# Patient Record
Sex: Female | Born: 2000 | Race: Black or African American | Hispanic: No | Marital: Single | Smoking: Never smoker
Health system: Southern US, Community
[De-identification: ages and names within clinical notes are randomized; demographics above are authoritative.]

## PROBLEM LIST (undated history)

## (undated) ENCOUNTER — Inpatient Hospital Stay (HOSPITAL_COMMUNITY): Payer: Self-pay

## (undated) DIAGNOSIS — E559 Vitamin D deficiency, unspecified: Secondary | ICD-10-CM

## (undated) DIAGNOSIS — E611 Iron deficiency: Secondary | ICD-10-CM

## (undated) HISTORY — PX: EYE SURGERY: SHX253

---
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
## 2019-01-16 ENCOUNTER — Other Ambulatory Visit: Payer: Self-pay

## 2019-08-03 ENCOUNTER — Ambulatory Visit (HOSPITAL_COMMUNITY)
Admission: EM | Admit: 2019-08-03 | Discharge: 2019-08-03 | Disposition: A | Discharge status: Home / Self Care | Payer: BLUE CROSS/BLUE SHIELD | Attending: Family Medicine | Admitting: Family Medicine

## 2019-08-03 ENCOUNTER — Encounter (HOSPITAL_COMMUNITY): Payer: Self-pay

## 2019-08-03 ENCOUNTER — Other Ambulatory Visit: Payer: Self-pay

## 2019-08-03 DIAGNOSIS — Z20822 Contact with and (suspected) exposure to covid-19: Secondary | ICD-10-CM | POA: Diagnosis not present

## 2019-08-03 DIAGNOSIS — R05 Cough: Secondary | ICD-10-CM | POA: Diagnosis present

## 2019-08-03 DIAGNOSIS — Z202 Contact with and (suspected) exposure to infections with a predominantly sexual mode of transmission: Secondary | ICD-10-CM | POA: Diagnosis present

## 2019-08-03 DIAGNOSIS — R059 Cough, unspecified: Secondary | ICD-10-CM

## 2019-08-03 DIAGNOSIS — Z113 Encounter for screening for infections with a predominantly sexual mode of transmission: Secondary | ICD-10-CM | POA: Diagnosis not present

## 2019-08-03 NOTE — Discharge Instructions (Addendum)
You will get your test results on MyChart Quarantine yourself at home until your coronavirus test is available Treat pain or fever with Tylenol Use over-the-counter cough and cold medicines You will be called if any of your test, positive Safe sex is recommended

## 2019-08-03 NOTE — ED Provider Notes (Signed)
[facility]    CSN: [id number] Arrival date & time: 08/03/19  1424      History   Chief Complaint Chief Complaint  Patient presents with  . Cough    HPI Hannah Mckay is a 19 y.o. female.   HPI  Patient is here for 2 complaints.  First she said a cough for about a week.  Some tiredness.  No fever or chills.  No loss of taste or smell.  No headache.  No known exposure to coronavirus.  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  She has had unprotected sex.  While she was here she would like to have STD testing.  She has no STD symptoms, discharge, pain, or rash.  History reviewed. No pertinent past medical history.  There are no problems to display for this patient.   History reviewed. No pertinent surgical history.  OB History   No obstetric history on file.      Home Medications    Prior to Admission medications   Medication Sig Start Date End Date Taking? Authorizing Provider  ibuprofen (ADVIL,MOTRIN) 200 MG tablet Take 200-400 mg by mouth every 6 (six) hours as needed for headache.    [provider]  Ketotifen Fumarate (ALLERGY EYE DROPS OP) Place 2 drops into both eyes daily as needed (for allergy eyes).    [provider]    Family History Family History  Problem Relation Age of Onset  . Diabetes Mother   . Asthma Mother   . Healthy Father     Social History Social History   Tobacco Use  . Smoking status: Never Smoker  . Smokeless tobacco: Never Used  Substance Use Topics  . Alcohol use: Yes    Comment: rare  . Drug use: Not on file     Allergies   Cherry   Review of Systems Review of Systems  Constitutional: Negative for chills and fever.  HENT: Negative for congestion and hearing loss.   Eyes: Negative for pain.  Respiratory: Positive for cough. Negative for shortness of breath.   Cardiovascular: Negative for chest pain and leg swelling.    Gastrointestinal: Negative for abdominal pain, constipation and diarrhea.  Genitourinary: Negative for dysuria and frequency.  Musculoskeletal: Negative for myalgias.  Neurological: Negative for dizziness, seizures and headaches.  Psychiatric/Behavioral: The patient is not nervous/anxious.      Physical Exam Triage Vital Signs ED Triage Vitals  Enc Vitals Group     BP 08/03/19 1715 101/63     Pulse Rate 08/03/19 1715 84     Resp 08/03/19 1715 16     Temp 08/03/19 1715 98.7 F (37.1 C)     Temp Source 08/03/19 1715 Oral     SpO2 08/03/19 1715 99 %     Weight --      Height --      Head Circumference --      Peak Flow --      Pain Score 08/03/19 1713 0     Pain Loc --      Pain Edu? --      Excl. in GC? --    No data found.  Updated Vital Signs BP 101/63 (BP Location: Left Arm)   Pulse 84   Temp 98.7 F (37.1 C) (Oral)   Resp 16   SpO2 99%   Visual Acuity Right Eye Distance:   Left Eye Distance:   Bilateral Distance:  Right Eye Near:   Left Eye Near:    Bilateral Near:     Physical Exam Constitutional:      General: She is not in acute distress.    Appearance: She is well-developed.  HENT:     Head: Normocephalic and atraumatic.     Mouth/Throat:     Comments: Mask in place Eyes:     Conjunctiva/sclera: Conjunctivae normal.     Pupils: Pupils are equal, round, and reactive to light.  Cardiovascular:     Rate and Rhythm: Normal rate.     Heart sounds: Normal heart sounds.  Pulmonary:     Effort: Pulmonary effort is normal. No respiratory distress.     Breath sounds: Normal breath sounds.     Comments: Lungs are clear Abdominal:     General: There is no distension.     Palpations: Abdomen is soft.  Genitourinary:    Comments: Exam deferred Musculoskeletal:        General: Normal range of motion.     Cervical back: Normal range of motion.  Skin:    General: Skin is warm and dry.  Neurological:     Mental Status: She is alert. Mental status is  at baseline.  Psychiatric:        Mood and Affect: Mood normal.      UC Treatments / Results  Labs (all labs ordered are listed, but only abnormal results are displayed) Labs Reviewed  NOVEL CORONAVIRUS, NAA (HOSP ORDER, SEND-OUT TO REF LAB; TAT 18-24 HRS)  CERVICOVAGINAL ANCILLARY ONLY    EKG   Radiology No results found.  Procedures Procedures (including critical care time)  Medications Ordered in UC Medications - No data to display  Initial Impression / Assessment and Plan / UC Course  I have reviewed the triage vital signs and the nursing notes.  Pertinent labs & imaging results that were available during my care of the patient were reviewed by me and considered in my medical decision making (see chart for details).     Testing for Covid and STD is performed Patient is told how to access her test We discussed the importance of quarantine until her results are available All questions answered Final Clinical Impressions(s) / UC Diagnoses   Final diagnoses:  Cough  Suspected COVID-19 virus infection  Possible exposure to STD     Discharge Instructions     You will get your test results on MyChart Quarantine yourself at home until your coronavirus test is available Treat pain or fever with Tylenol Use over-the-counter cough and cold medicines You will be called if any of your test, positive Safe sex is recommended   ED Prescriptions    None     PDMP not reviewed this encounter.   Raylene Everts, MD 08/03/19 1755

## 2019-08-03 NOTE — ED Triage Notes (Signed)
Patient presents to Urgent Care with complaints of cough since about a week ago. Patient reports she has also been having nosebleeds (educated on purchase of a humidifier).

## 2019-08-04 LAB — NOVEL CORONAVIRUS, NAA (HOSP ORDER, SEND-OUT TO REF LAB; TAT 18-24 HRS): SARS-CoV-2, NAA: NOT DETECTED

## 2019-08-04 LAB — CERVICOVAGINAL ANCILLARY ONLY
Chlamydia: POSITIVE — AB
Neisseria Gonorrhea: NEGATIVE
Trichomonas: NEGATIVE

## 2019-08-05 ENCOUNTER — Telehealth: Payer: Self-pay | Admitting: Emergency Medicine

## 2019-08-05 MED ORDER — AZITHROMYCIN 250 MG PO TABS: 1000.0000 mg | ORAL_TABLET | Freq: Once | ORAL | 0 refills | Status: AC

## 2019-08-05 NOTE — Telephone Encounter (Signed)
Chlamydia is positive.  Rx po zithromax 1g #1 dose no refills was sent to the pharmacy of record.  Please refrain from sexual intercourse for 7 days to give the medicine time to work, sexual partners need to be notified and tested/treated.  Condoms may reduce risk of reinfection.  Recheck or followup with PCP for further evaluation if symptoms are not improving.   [facility] notified.  Patient contacted by phone and made aware of    results. Pt verbalized understanding and had all questions answered.    

## 2019-10-13 ENCOUNTER — Emergency Department (HOSPITAL_COMMUNITY): Payer: No Typology Code available for payment source

## 2019-10-13 ENCOUNTER — Encounter (HOSPITAL_COMMUNITY): Payer: Self-pay

## 2019-10-13 ENCOUNTER — Emergency Department (HOSPITAL_COMMUNITY)
Admission: EM | Admit: 2019-10-13 | Discharge: 2019-10-13 | Disposition: A | Discharge status: Home / Self Care | Payer: No Typology Code available for payment source | Attending: Emergency Medicine | Admitting: Emergency Medicine

## 2019-10-13 ENCOUNTER — Other Ambulatory Visit: Payer: Self-pay

## 2019-10-13 DIAGNOSIS — Y999 Unspecified external cause status: Secondary | ICD-10-CM | POA: Diagnosis not present

## 2019-10-13 DIAGNOSIS — Y9389 Activity, other specified: Secondary | ICD-10-CM | POA: Diagnosis not present

## 2019-10-13 DIAGNOSIS — Z79899 Other long term (current) drug therapy: Secondary | ICD-10-CM | POA: Insufficient documentation

## 2019-10-13 DIAGNOSIS — Y92410 Unspecified street and highway as the place of occurrence of the external cause: Secondary | ICD-10-CM | POA: Insufficient documentation

## 2019-10-13 DIAGNOSIS — S3992XA Unspecified injury of lower back, initial encounter: Secondary | ICD-10-CM | POA: Diagnosis present

## 2019-10-13 DIAGNOSIS — M7918 Myalgia, other site: Secondary | ICD-10-CM | POA: Insufficient documentation

## 2019-10-13 DIAGNOSIS — S39012A Strain of muscle, fascia and tendon of lower back, initial encounter: Secondary | ICD-10-CM | POA: Insufficient documentation

## 2019-10-13 LAB — URINALYSIS, ROUTINE W REFLEX MICROSCOPIC
Bilirubin Urine: NEGATIVE
Glucose, UA: NEGATIVE mg/dL
Hgb urine dipstick: NEGATIVE
Ketones, ur: NEGATIVE mg/dL
Nitrite: NEGATIVE
Protein, ur: 100 mg/dL — AB
Specific Gravity, Urine: 1.017 (ref 1.005–1.030)
pH: 7 (ref 5.0–8.0)

## 2019-10-13 LAB — PREGNANCY, URINE: Preg Test, Ur: NEGATIVE

## 2019-10-13 MED ORDER — ACETAMINOPHEN 325 MG PO TABS
650.0000 mg | ORAL_TABLET | Freq: Once | ORAL | Status: AC
  Administered 2019-10-13: 650 mg via ORAL
  Filled 2019-10-13: qty 2

## 2019-10-13 MED ORDER — NAPROXEN 375 MG PO TABS: 375.0000 mg | ORAL_TABLET | Freq: Two times a day (BID) | ORAL | 0 refills | Status: DC

## 2019-10-13 MED ORDER — METHOCARBAMOL 500 MG PO TABS: 1000.0000 mg | ORAL_TABLET | Freq: Four times a day (QID) | ORAL | 0 refills | Status: DC
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
## 2019-10-13 NOTE — ED Notes (Signed)
Pt discharge instructions reviewed with patient. The patient verbalized understanding of instructions. Pt discharged. 
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
## 2019-10-31 ENCOUNTER — Encounter (HOSPITAL_COMMUNITY): Payer: Self-pay | Admitting: Emergency Medicine

## 2019-10-31 ENCOUNTER — Emergency Department (HOSPITAL_COMMUNITY)
Admission: EM | Admit: 2019-10-31 | Discharge: 2019-11-01 | Disposition: A | Discharge status: Home / Self Care | Payer: BC Managed Care – PPO | Attending: Emergency Medicine | Admitting: Emergency Medicine

## 2019-10-31 ENCOUNTER — Other Ambulatory Visit: Payer: Self-pay

## 2019-10-31 ENCOUNTER — Emergency Department (HOSPITAL_COMMUNITY): Payer: BC Managed Care – PPO

## 2019-10-31 DIAGNOSIS — Z79899 Other long term (current) drug therapy: Secondary | ICD-10-CM | POA: Diagnosis not present

## 2019-10-31 DIAGNOSIS — R102 Pelvic and perineal pain: Secondary | ICD-10-CM | POA: Diagnosis present

## 2019-10-31 DIAGNOSIS — U071 COVID-19: Secondary | ICD-10-CM | POA: Insufficient documentation

## 2019-10-31 DIAGNOSIS — N760 Acute vaginitis: Secondary | ICD-10-CM | POA: Insufficient documentation

## 2019-10-31 DIAGNOSIS — B9689 Other specified bacterial agents as the cause of diseases classified elsewhere: Secondary | ICD-10-CM | POA: Insufficient documentation

## 2019-10-31 DIAGNOSIS — N390 Urinary tract infection, site not specified: Secondary | ICD-10-CM

## 2019-10-31 DIAGNOSIS — R10813 Right lower quadrant abdominal tenderness: Secondary | ICD-10-CM | POA: Diagnosis not present

## 2019-10-31 DIAGNOSIS — R10815 Periumbilic abdominal tenderness: Secondary | ICD-10-CM | POA: Insufficient documentation

## 2019-10-31 DIAGNOSIS — N739 Female pelvic inflammatory disease, unspecified: Secondary | ICD-10-CM | POA: Diagnosis not present

## 2019-10-31 HISTORY — DX: Iron deficiency: E61.1

## 2019-10-31 HISTORY — DX: Vitamin D deficiency, unspecified: E55.9

## 2019-10-31 LAB — URINALYSIS, ROUTINE W REFLEX MICROSCOPIC
Bilirubin Urine: NEGATIVE
Glucose, UA: NEGATIVE mg/dL
Hgb urine dipstick: NEGATIVE
Ketones, ur: NEGATIVE mg/dL
Nitrite: NEGATIVE
Protein, ur: NEGATIVE mg/dL
Specific Gravity, Urine: 1.019 (ref 1.005–1.030)
pH: 7 (ref 5.0–8.0)

## 2019-10-31 LAB — COMPREHENSIVE METABOLIC PANEL
ALT: 12 U/L (ref 0–44)
AST: 23 U/L (ref 15–41)
Albumin: 4.1 g/dL (ref 3.5–5.0)
Alkaline Phosphatase: 45 U/L (ref 38–126)
Anion gap: 11 (ref 5–15)
BUN: 10 mg/dL (ref 6–20)
CO2: 22 mmol/L (ref 22–32)
Calcium: 9.3 mg/dL (ref 8.9–10.3)
Chloride: 106 mmol/L (ref 98–111)
Creatinine, Ser: 0.79 mg/dL (ref 0.44–1.00)
GFR calc Af Amer: >60 mL/min (ref >60)
GFR calc non Af Amer: >60 mL/min (ref >60)
Glucose, Bld: 78 mg/dL (ref 70–99)
Potassium: 3.7 mmol/L (ref 3.5–5.1)
Sodium: 139 mmol/L (ref 135–145)
Total Bilirubin: 0.4 mg/dL (ref 0.3–1.2)
Total Protein: 7.5 g/dL (ref 6.5–8.1)

## 2019-10-31 LAB — CBC
HCT: 45.9 % (ref 36.0–46.0)
Hemoglobin: 14.5 g/dL (ref 12.0–15.0)
MCH: 27.9 pg (ref 26.0–34.0)
MCHC: 31.6 g/dL (ref 30.0–36.0)
MCV: 88.3 fL (ref 80.0–100.0)
Platelets: 305 10*3/uL (ref 150–400)
RBC: 5.2 MIL/uL — ABNORMAL HIGH (ref 3.87–5.11)
RDW: 13.5 % (ref 11.5–15.5)
WBC: 3.8 10*3/uL — ABNORMAL LOW (ref 4.0–10.5)
nRBC: 0 % (ref 0.0–0.2)

## 2019-10-31 LAB — HIV ANTIBODY (ROUTINE TESTING W REFLEX): HIV Screen 4th Generation wRfx: NONREACTIVE

## 2019-10-31 LAB — I-STAT BETA HCG BLOOD, ED (MC, WL, AP ONLY): I-stat hCG, quantitative: <5 m[IU]/mL (ref <5)

## 2019-10-31 LAB — POC SARS CORONAVIRUS 2 AG -  ED: SARS Coronavirus 2 Ag: POSITIVE — AB

## 2019-10-31 LAB — WET PREP, GENITAL
Sperm: NONE SEEN
Trich, Wet Prep: NONE SEEN
Yeast Wet Prep HPF POC: NONE SEEN

## 2019-10-31 LAB — LIPASE, BLOOD: Lipase: 25 U/L (ref 11–51)

## 2019-10-31 MED ORDER — IOHEXOL 300 MG/ML  SOLN
100.0000 mL | Freq: Once | INTRAMUSCULAR | Status: AC | PRN
  Administered 2019-10-31: 100 mL via INTRAVENOUS

## 2019-10-31 MED ORDER — SODIUM CHLORIDE 0.9% FLUSH
3.0000 mL | Freq: Once | INTRAVENOUS | Status: AC
  Administered 2019-10-31: 19:00:00 3 mL via INTRAVENOUS

## 2019-10-31 MED ORDER — MORPHINE SULFATE (PF) 2 MG/ML IV SOLN
2.0000 mg | Freq: Once | INTRAVENOUS | Status: AC
  Administered 2019-10-31: 2 mg via INTRAVENOUS
  Filled 2019-10-31: qty 1

## 2019-10-31 MED ORDER — SODIUM CHLORIDE 0.9 % IV BOLUS
1000.0000 mL | Freq: Once | INTRAVENOUS | Status: AC
  Administered 2019-10-31: 22:00:00 1000 mL via INTRAVENOUS

## 2019-10-31 NOTE — ED Provider Notes (Signed)
[facility] EMERGENCY DEPARTMENT Provider Note   CSN: [id number] Arrival date & time: 10/31/19  1744     History Chief Complaint  Patient presents with  . Abdominal Pain    Hannah Mckay is a 19 y.o. female with no pertinent past medical history presents today for evaluation of multiple complaints.  She was originally seen at [facility] urgent care and referred here for possible CT scan.  She reports that she has had pelvic pain, spotting, and blood in her urine yesterday.  She reports dyspareunia the last time she had sex which was last Friday.  Her symptoms started last Saturday.  She reports that she is significantly nauseated, has lost 8 to 10 pounds.  She complains of pain generally in her abdomen and her back.  She reports dysuria, increased frequency and urgency.  She also reports she noted that she had a sore on her mouth however that is gone away.  She states that it was nonpainful.  She reports she has had fevers over 100.4.  She states she has Nexplanon implant, denies any possibility of pregnancy.  She reports she has had increased discharge over the past 3 to 4 days.  HPI     Past Medical History:  Diagnosis Date  . Iron deficiency   . Vitamin D deficiency     There are no problems to display for this patient.   Past Surgical History:  Procedure Laterality Date  . EYE SURGERY       OB History   No obstetric history on file.     Family History  Problem Relation Age of Onset  . Diabetes Mother   . Asthma Mother   . Healthy Father     Social History   Tobacco Use  . Smoking status: Never Smoker  . Smokeless tobacco: Never Used  Substance Use Topics  . Alcohol use: Not Currently    Comment: rare  . Drug use: Never    Home Medications Prior to Admission medications   Medication Sig Start Date End Date Taking? Authorizing Provider  acetaminophen (TYLENOL) 500 MG tablet Take 500 mg by mouth every 6 (six) hours as needed for  moderate pain.   Yes [provider]  cholecalciferol (VITAMIN D3) 25 MCG (1000 UNIT) tablet Take 1,000 Units by mouth daily.   Yes [provider]  Ferrous Sulfate (IRON PO) Take 1 tablet by mouth daily.   Yes [provider]  methocarbamol (ROBAXIN) 500 MG tablet Take 2 tablets (1,000 mg total) by mouth 4 (four) times daily. Patient taking differently: Take 500 mg by mouth every 6 (six) hours as needed.  10/13/19  Yes Renne Crigler, PA-C  naproxen (NAPROSYN) 375 MG tablet Take 1 tablet (375 mg total) by mouth 2 (two) times daily. Patient taking differently: Take 375 mg by mouth 2 (two) times daily as needed for moderate pain.  10/13/19  Yes Renne Crigler, PA-C  vitamin B-12 (CYANOCOBALAMIN) 100 MCG tablet Take 100 mcg by mouth daily.   Yes [provider]  cephALEXin (KEFLEX) 500 MG capsule Take 1 capsule (500 mg total) by mouth 2 (two) times daily for 10 days. 11/01/19 11/11/19  Cristina Gong, PA-C  doxycycline (VIBRAMYCIN) 100 MG capsule Take 1 capsule (100 mg total) by mouth 2 (two) times daily for 14 days. 11/01/19 11/15/19  Cristina Gong, PA-C  metroNIDAZOLE (FLAGYL) 500 MG tablet Take 1 tablet (500 mg total) by mouth 2 (two) times daily for 14 days.  11/01/19 11/15/19  Cristina Gong, PA-C  ondansetron (ZOFRAN ODT) 4 MG disintegrating tablet Take 1 tablet (4 mg total) by mouth every 6 (six) hours as needed for nausea or vomiting. 11/01/19   Cristina Gong, PA-C    Allergies    Bee venom and Cherry  Review of Systems   Review of Systems  Constitutional: Positive for chills, fatigue and fever.  HENT: Negative for congestion, ear pain, postnasal drip, sinus pressure, sore throat and trouble swallowing.   Eyes: Negative for visual disturbance.  Respiratory: Negative for cough, chest tightness and shortness of breath.   Cardiovascular: Negative for chest pain (Occasional, after coughing started).  Gastrointestinal: Negative for abdominal  pain, diarrhea, nausea and vomiting.  Genitourinary: Positive for dyspareunia, dysuria, flank pain, frequency, pelvic pain, urgency, vaginal bleeding, vaginal discharge and vaginal pain.  Musculoskeletal: Positive for arthralgias and myalgias. Negative for back pain and neck pain.  Skin: Negative for color change, pallor and rash.  Neurological: Negative for weakness and headaches.  Psychiatric/Behavioral: Negative for confusion.  All other systems reviewed and are negative.   Physical Exam Updated Vital Signs BP 112/85   Pulse 70   Temp 98.7 F (37.1 C) (Oral)   Resp 16   Ht 5\' 3"  (1.6 m)   Wt 40.8 kg   SpO2 99%   BMI 15.94 kg/m   Physical Exam Vitals and nursing note reviewed. Exam conducted with a chaperone present.  Constitutional:      General: She is not in acute distress. HENT:     Head: Normocephalic and atraumatic.  Eyes:     Conjunctiva/sclera: Conjunctivae normal.  Cardiovascular:     Rate and Rhythm: Normal rate and regular rhythm.     Heart sounds: No murmur.  Pulmonary:     Effort: Pulmonary effort is normal. No respiratory distress.     Breath sounds: Normal breath sounds.  Abdominal:     General: Abdomen is flat. Bowel sounds are normal.     Palpations: Abdomen is soft.     Tenderness: There is abdominal tenderness in the right lower quadrant and suprapubic area.     Hernia: No hernia is present.  Genitourinary:    Vagina: No vaginal discharge, tenderness or bleeding.     Cervix: Cervical motion tenderness present.     Adnexa:        Right: No tenderness or fullness.         Left: Tenderness present. No fullness.    Musculoskeletal:     Cervical back: Neck supple.  Skin:    General: Skin is warm and dry.  Neurological:     Mental Status: She is alert.     ED Results / Procedures / Treatments   Labs (all labs ordered are listed, but only abnormal results are displayed) Labs Reviewed  WET PREP, GENITAL - Abnormal; Notable for the following  components:      Result Value   Clue Cells Wet Prep HPF POC PRESENT (*)    WBC, Wet Prep HPF POC MANY (*)    All other components within normal limits  CBC - Abnormal; Notable for the following components:   WBC 3.8 (*)    RBC 5.20 (*)    All other components within normal limits  URINALYSIS, ROUTINE W REFLEX MICROSCOPIC - Abnormal; Notable for the following components:   Leukocytes,Ua TRACE (*)    Bacteria, UA MANY (*)    All other components within normal limits  POC SARS CORONAVIRUS 2 AG -  ED - Abnormal; Notable for the following components:   SARS Coronavirus 2 Ag POSITIVE (*)    All other components within normal limits  URINE CULTURE  LIPASE, BLOOD  COMPREHENSIVE METABOLIC PANEL  HIV ANTIBODY (ROUTINE TESTING W REFLEX)  RPR  I-STAT BETA HCG BLOOD, ED ([facility], [facility], [facility] ONLY)  GC/CHLAMYDIA PROBE AMP ([place]) NOT AT [facility]    EKG None  Radiology CT Abdomen Pelvis W Contrast  Result Date: 10/31/2019 CLINICAL DATA:  COVID-19 positivity with abdominal pain EXAM: CT ABDOMEN AND PELVIS WITH CONTRAST TECHNIQUE: Multidetector CT imaging of the abdomen and pelvis was performed using the standard protocol following bolus administration of intravenous contrast. CONTRAST:  OMNIPAQUE IOHEXOL 300 MG/ML  SOLN COMPARISON:  None. FINDINGS: Lower chest: No acute abnormality. Hepatobiliary: No focal liver abnormality is seen. No gallstones, gallbladder wall thickening, or biliary dilatation. Pancreas: Unremarkable. No pancreatic ductal dilatation or surrounding inflammatory changes. Spleen: Normal in size without focal abnormality. Adrenals/Urinary Tract: Adrenal glands are within normal limits. Kidneys demonstrate a normal enhancement pattern. No obstructive changes are seen. Normal excretion is noted. The bladder is decompressed. Stomach/Bowel: The appendix is well visualized and within normal limits. No obstructive or inflammatory changes of the colon or small bowel are seen. The stomach is  within normal limits. Vascular/Lymphatic: No significant vascular findings are present. No enlarged abdominal or pelvic lymph nodes. Reproductive: Uterus and bilateral adnexa are unremarkable. Other: Mild free fluid is noted within the pelvis. This is felt to be physiologic in nature. Musculoskeletal: No acute or significant osseous findings. IMPRESSION: No acute abnormality noted. Electronically Signed   By: Alcide Clever M.D.   On: 10/31/2019 23:54    Procedures Procedures (including critical care time)  Medications Ordered in ED Medications  ondansetron (ZOFRAN-ODT) disintegrating tablet 4 mg (has no administration in time range)  cefTRIAXone (ROCEPHIN) injection 250 mg (has no administration in time range)  azithromycin (ZITHROMAX) tablet 1,000 mg (has no administration in time range)  sterile water (preservative free) injection (has no administration in time range)  sodium chloride flush (NS) 0.9 % injection 3 mL (3 mLs Intravenous Given 10/31/19 1904)  sodium chloride 0.9 % bolus 1,000 mL (0 mLs Intravenous Stopped 10/31/19 2358)  iohexol (OMNIPAQUE) 300 MG/ML solution 100 mL (100 mLs Intravenous Contrast Given 10/31/19 2322)  morphine 2 MG/ML injection 2 mg (2 mg Intravenous Given 10/31/19 2357)    ED Course  I have reviewed the triage vital signs and the nursing notes.  Pertinent labs & imaging results that were available during my care of the patient were reviewed by me and considered in my medical decision making (see chart for details).    MDM Rules/Calculators/A&P                     Patient is a 19 year old woman who presents today for evaluation of multiple complaints. She reports dysuria with increased frequency and urgency for the past week with associated back and flank pain.  Urine obtained with 0-5 squamous epithelial cells, 6-10 white cells, many bacteria.  Based on her significant symptoms suspect this is a UTI.  Urine is sent for culture.  Pregnancy test is negative.  CBC  shows leukopenia with a white count of 3.4, she is not significantly anemic and CMP is unremarkable. Based on her reported fevers and leukopenia Covid testing was obtained which was positive.  I discussed with patient quarantine precautions for her and any close contacts, along with the need for quarantine at  home, recommendation for Tylenol over ibuprofen. On pelvic exam there was cervical motion tenderness and pain in the left adnexa.  On abdominal exam she had pain in the right lower quadrant.  She still has her appendix. Based on the combination of her symptoms including possible pyelonephritis, cervical motion tenderness concerning for PID and right lower quadrant pain concerning for possible appendicitis CT scan was obtained without evidence of acute significant abnormalities.  I spoke with pharmacy to discuss appropriate antibiotic choice. Patient is given Rocephin and azithromycin in the emergency room.  Her wet prep was also positive for BV. Based on her significant urinary symptoms, and many bacteria we will treat for urinary tract infection with Keflex.  Covid testing is sent. In addition she requires treatment for PID.  She will be treated with Flagyl, which will also cover her BV, and doxycycline.  We discussed the importance of not drinking alcohol while taking these medications.  Prescriptions for antibiotics in addition to Zofran was sent to patient's pharmacy.  Kaizley Aja was evaluated in Emergency Department on 11/01/2019 for the symptoms described in the history of present illness. She was evaluated in the context of the global COVID-19 pandemic, which necessitated consideration that the patient might be at risk for infection with the SARS-CoV-2 virus that causes COVID-19. Institutional protocols and algorithms that pertain to the evaluation of patients at risk for COVID-19 are in a state of rapid change based on information released by regulatory bodies including the CDC and  federal and state organizations. These policies and algorithms were followed during the patient's care in the ED.  Return precautions were discussed with patient who states their understanding.   patient denied any unaddressed complaints or concerns.  Patient is agreeable for discharge home.  Note: Portions of this report may have been transcribed using voice recognition software. Every effort was made to ensure accuracy; however, inadvertent computerized transcription errors may be present    Final Clinical Impression(s) / ED Diagnoses Final diagnoses:  COVID-19  Lower urinary tract infectious disease  PID (pelvic inflammatory disease)  BV (bacterial vaginosis)    Rx / DC Orders ED Discharge Orders         Ordered    cephALEXin (KEFLEX) 500 MG capsule  2 times daily     11/01/19 0029    doxycycline (VIBRAMYCIN) 100 MG capsule  2 times daily     11/01/19 0029    metroNIDAZOLE (FLAGYL) 500 MG tablet  2 times daily     11/01/19 0029    ondansetron (ZOFRAN ODT) 4 MG disintegrating tablet  Every 6 hours PRN     11/01/19 0029           Lorin Glass, PA-C 11/01/19 1522    Tegeler, Gwenyth Allegra, MD 11/03/19 [phone]

## 2019-10-31 NOTE — ED Notes (Signed)
Dr Tegeler informed pt covid test pos

## 2019-10-31 NOTE — ED Triage Notes (Signed)
Sent here from urgent care-- has abd pain -- pelvic pain, spotting and blood in urine yesterday--- had UA at urgent care-told to come here for CT scan -- also c/o sore in mouth -- painless -- was told at urgent care that she may have syphillus-- pt is very anxious and asking for lab test and pelvic exam

## 2019-10-31 NOTE — ED Notes (Signed)
Pt. Stated she went to [facility] and was advised to come here for a CT scan. Stating the provider was unable to give any diagnosis. Pt. States she has lost 8-10 lbs and have been nauseated. C/O lower abdominal pain 6/10 that radiates to back. Last period was over a year ago.  Pt. Is sexually active. No new partners in the last few months. Denies any trauma.  C/O mouth pain to the back right side of mouth.

## 2019-10-31 NOTE — ED Notes (Signed)
Pelvic Cart prepared and at bedside.

## 2019-11-01 ENCOUNTER — Encounter (HOSPITAL_COMMUNITY): Payer: Self-pay | Admitting: Physician Assistant

## 2019-11-01 LAB — RPR: RPR Ser Ql: NONREACTIVE

## 2019-11-01 MED ORDER — DOXYCYCLINE HYCLATE 100 MG PO CAPS: 100.0000 mg | ORAL_CAPSULE | Freq: Two times a day (BID) | ORAL | 0 refills | Status: AC

## 2019-11-01 MED ORDER — ONDANSETRON 4 MG PO TBDP
4.0000 mg | ORAL_TABLET | Freq: Once | ORAL | Status: AC
  Administered 2019-11-01: 01:00:00 4 mg via ORAL
  Filled 2019-11-01: qty 1

## 2019-11-01 MED ORDER — ONDANSETRON HCL 4 MG/2ML IJ SOLN: 4.0000 mg | Freq: Once | INTRAMUSCULAR | Status: DC

## 2019-11-01 MED ORDER — METRONIDAZOLE 500 MG PO TABS: 2000.0000 mg | ORAL_TABLET | Freq: Once | ORAL | Status: DC

## 2019-11-01 MED ORDER — CEPHALEXIN 500 MG PO CAPS: 500.0000 mg | ORAL_CAPSULE | Freq: Two times a day (BID) | ORAL | 0 refills | Status: AC

## 2019-11-01 MED ORDER — STERILE WATER FOR INJECTION IJ SOLN
INTRAMUSCULAR | Status: AC
  Administered 2019-11-01: 1 mL
  Filled 2019-11-01: qty 10

## 2019-11-01 MED ORDER — CEFTRIAXONE SODIUM 500 MG IJ SOLR
250.0000 mg | Freq: Once | INTRAMUSCULAR | Status: AC
  Administered 2019-11-01: 250 mg via INTRAMUSCULAR
  Filled 2019-11-01: qty 500

## 2019-11-01 MED ORDER — ONDANSETRON 4 MG PO TBDP: 4.0000 mg | ORAL_TABLET | Freq: Four times a day (QID) | ORAL | 0 refills | Status: AC | PRN

## 2019-11-01 MED ORDER — PROMETHAZINE HCL 25 MG/ML IJ SOLN
12.5000 mg | Freq: Once | INTRAMUSCULAR | Status: AC
  Administered 2019-11-01: 02:00:00 12.5 mg via INTRAVENOUS
  Filled 2019-11-01: qty 1

## 2019-11-01 MED ORDER — METRONIDAZOLE 500 MG PO TABS: 500.0000 mg | ORAL_TABLET | Freq: Two times a day (BID) | ORAL | 0 refills | Status: AC

## 2019-11-01 MED ORDER — AZITHROMYCIN 250 MG PO TABS
1000.0000 mg | ORAL_TABLET | Freq: Once | ORAL | Status: AC
  Administered 2019-11-01: 1000 mg via ORAL
  Filled 2019-11-01: qty 4

## 2019-11-01 NOTE — ED Notes (Signed)
Pt tolerated PO Intake well; EDP made aware. Pt appropriate for discharge at this time.

## 2019-11-01 NOTE — ED Notes (Signed)
Pt to reattempt PO Challenge after Phenergan administration. This RN will continue to monitor

## 2019-11-01 NOTE — ED Notes (Signed)
Following fluid/PO challenge, pt vomited 2x. Emesis appeared to be the apple juice that NT provided for the fluid PO challenge. No other fluids or foods administered for this challenge or since pt experienced emesis.

## 2019-11-01 NOTE — Discharge Instructions (Addendum)
You must quarantine yourself at home.  You need to remain quarantine for a minimum of 10 days from the start of your symptoms.  After that you must be fever free without the use of ibuprofen or Tylenol for at least 48 hours.  Please notify anyone who you have been around in the past 10-14 days that they need to quarantine and monitor for symptoms.  Do not have any sexual contact for 2 weeks after you finish your treatment.  If you test positive he will need to notify partners.  You may have diarrhea from the antibiotics.  It is very important that you continue to take the antibiotics even if you get diarrhea unless a medical professional tells you that you may stop taking them.  If you stop too early the bacteria you are being treated for will become stronger and you may need different, more powerful antibiotics that have more side effects and worsening diarrhea.  Please stay well hydrated and consider probiotics as they may decrease the severity of your diarrhea.  Please be aware that if you take any hormonal contraception (birth control pills, nexplanon, the ring, etc) that your birth control will not work while you are taking antibiotics and you need to use back up protection as directed on the birth control medication information insert.   Please take Tylenol (acetaminophen) to relieve your pain.  You may take tylenol, up to 1,000 mg (two extra strength pills).  Do not take more than 3,000 mg tylenol in a 24 hour period.  Please check all medication labels as many medications such as pain and cold medications may contain tylenol. Please do not drink alcohol while taking this medication.   Today your diagnosed with bacterial vaginosis and received a prescription for metronidazole also known as Flagyl. It is very important that you do not consume any alcohol while taking this medication as it will cause you to become violently ill.

## 2019-11-01 NOTE — ED Notes (Signed)
Pt provided with Apple Juice as PO Challenge; this RN will reassess pt in 10-15 minutes to assess toleration

## 2019-11-03 LAB — GC/CHLAMYDIA PROBE AMP (~~LOC~~) NOT AT ARMC
Chlamydia: NEGATIVE
Comment: NEGATIVE
Comment: NORMAL
Neisseria Gonorrhea: NEGATIVE

## 2019-11-03 LAB — URINE CULTURE: Culture: >=100000 — AB

## 2019-11-04 ENCOUNTER — Telehealth: Payer: Self-pay

## 2019-11-04 NOTE — Telephone Encounter (Signed)
Post ED Visit - Positive Culture Follow-up  Culture report reviewed by antimicrobial stewardship pharmacist: [facility] []  , Pharm.D. [x]  Enzo Bi, Pharm.D., BCPS AQ-ID []  , Pharm.D., BCPS []  Celedonio Miyamoto, Pharm.D., BCPS []  [place], Garvin Fila.D., BCPS, AAHIVP []  , Pharm.D., BCPS, AAHIVP []  Georgina Pillion, PharmD, BCPS []  , PharmD, BCPS []  [place], PharmD, BCPS []  [address], PharmD []  , PharmD, BCPS []  Estella Husk, PharmD  Pharmacy Team []  Lysle Pearl, PharmD []  , PharmD []  Phillips Climes, PharmD []  , Rph []  Agapito Games) , PharmD []  Verlan Friends, PharmD []  , PharmD []  Mervyn Gay, PharmD []  , PharmD []  Vinnie Level, PharmD []  [facility], PharmD []  , PharmD []  Len Childs, PharmD   Positive urine culture Treated with Cephalexin, organism sensitive to the same and no further patient follow-up is required at this time.  11/04/2019, 9:50 AM

## 2019-11-06 ENCOUNTER — Ambulatory Visit: Payer: Self-pay | Admitting: *Deleted

## 2019-11-06 NOTE — Telephone Encounter (Signed)
Patient calling with complaints of vomiting with streaks of blood x 2 today. Pt also has abdominal pain and diarrhea. Pt previously seen in ED on 4/2 and was diagnosed with Covid-19. Pt was prescribed antibiotics and and antiemetic whiich is not working. Pt states he has emesis everytime she takes prescribed medication even with food. Pt states she is not sure if she is able to keep water down and has little urine output. Pt advised to seek treatment in the ED. Understanding verbalized.Notified Drew at [facility] ED that patient was coming to the ED and was diagnosed with Covid-19 on 10/31/19.   Reason for Disposition . [1] Vomited blood AND [2] more than a few streaks of blood    (Exception: few streaks and only occurred once)    (Exception: swallowed blood from a nosebleed or cut in the mouth)  Answer Assessment - Initial Assessment Questions 1. APPEARANCE of BLOOD: "What does the blood look like?" (e.g., color, coffee-grounds)     Bright red blood 2. AMOUNT: "How much blood was lost?"     Streaks of blood mixed in with vomit 3. VOMITING BLOOD: "How many times did it happen?" or "How many times in the past 24 hours?"     Vomited 4 times in the  4. VOMITING WITHOUT BLOOD: "How many times in the past 24 hours?"   5. ONSET: "When did vomiting of blood begin?"    Patient states she has been vomiting every time she takes medications previously prescribed in the ED 6. CAUSE: "What do you think is causing the vomiting of blood?"     Prescription medications 7. BLOOD THINNERS: "Do you take any blood thinners?" (e.g., Coumadin/warfarin, Pradaxa/dabigatran, aspirin)     no 8. DEHYDRATION: "Are there any signs of dehydration?" "When was the last time you urinated?" "Do you feel dizzy?"     Not peeing enough still having that issues since previously going to the ed 9. ABDOMINAL PAIN: "Are you having any abdominal pain?" If yes: "What does it feel like? " (e.g., crampy, dull, intermittent, constant)   yes 10. DIARRHEA: "Is there any diarrhea?" If so, ask: "How many times today?"        Yes, number of times not verbbalized 11. OTHER SYMPTOMS: "Do you have any other symptoms?" (e.g., fever, blood in stool)       no 12. PREGNANCY: "Is there any chance you are pregnant?" "When was your last menstrual period?" n/a  Protocols used: VOMITING BLOOD-A-AH

## 2020-08-24 ENCOUNTER — Ambulatory Visit: Payer: Self-pay

## 2020-10-21 ENCOUNTER — Ambulatory Visit (HOSPITAL_BASED_OUTPATIENT_CLINIC_OR_DEPARTMENT_OTHER): Payer: Self-pay | Admitting: Family Medicine

## 2020-11-01 ENCOUNTER — Encounter (HOSPITAL_BASED_OUTPATIENT_CLINIC_OR_DEPARTMENT_OTHER): Payer: Self-pay | Admitting: Family Medicine

## 2020-12-01 ENCOUNTER — Ambulatory Visit (HOSPITAL_COMMUNITY): Admission: EM | Admit: 2020-12-01 | Discharge: 2020-12-01 | Payer: 59

## 2020-12-01 ENCOUNTER — Other Ambulatory Visit: Payer: Self-pay

## 2020-12-01 NOTE — ED Notes (Signed)
Spoke to dr hagler about this patient.  Will get urine preg and determine plan of care.  Patient reports she has called ob/gyn.  And had not heard back at this time.  Patient says she is 3 months pregnant

## 2020-12-01 NOTE — ED Notes (Signed)
Called to lobby.  Patient reports her ob/gyn has called and is asking her to come to their office.  Patient has friend with her.  Patient wishes to go to ob/gyn office

## 2022-02-24 IMAGING — CT CT ABD-PELV W/ CM
2 of 4 series · 16 of 46 positions shown, 18 images · IV contrast (omnipaque)
Comparison: None.

CLINICAL DATA: [id number] positivity with abdominal pain

EXAM:
CT ABDOMEN AND PELVIS WITH CONTRAST
TECHNIQUE: Multidetector CT imaging of the abdomen and pelvis was performed
using the standard protocol following bolus administration of
intravenous contrast.
CONTRAST:  100mL OMNIPAQUE IOHEXOL 300 MG/ML  SOLN

[Series 3: abd/ pelvis 5.0 i30f 2 · axial · 0.57mm/px · z∈[+979,+1339]mm · 13 of 80 slices shown, 15 images]
[im 4/80  soft-tissue]
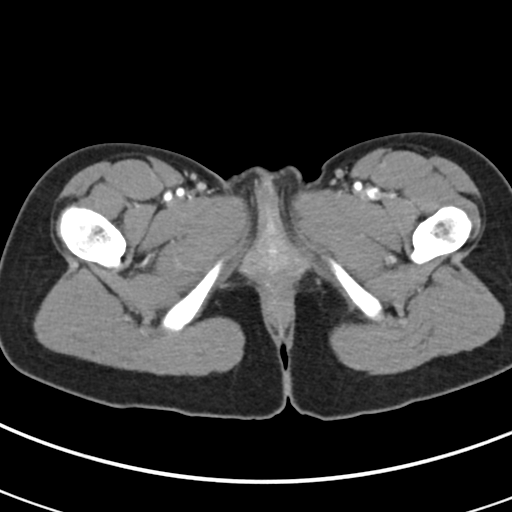
[im 4/80  bone]
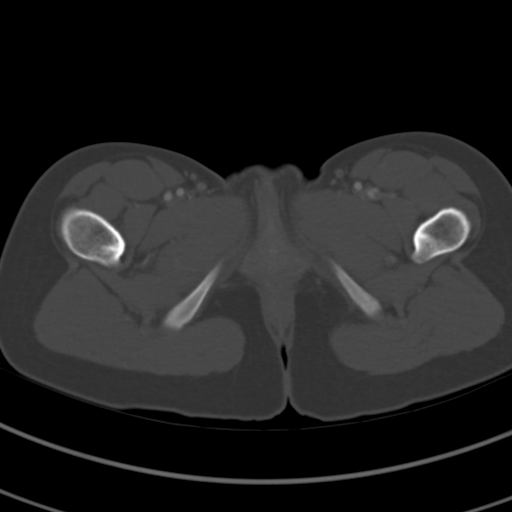
[im 11/80  soft-tissue]
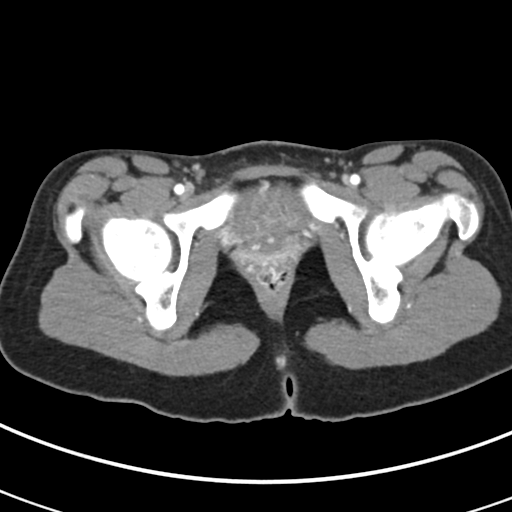
[im 18/80  soft-tissue]
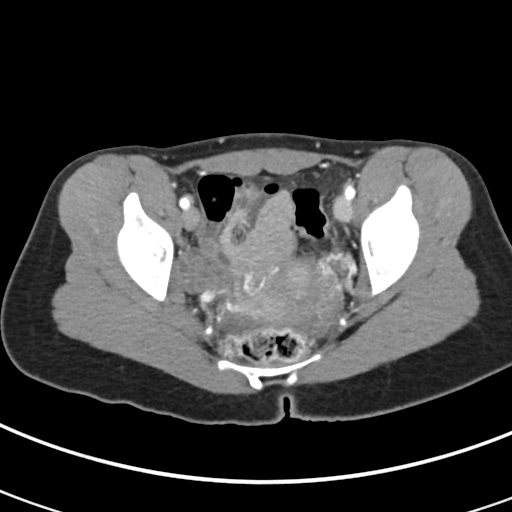
[im 22/80  soft-tissue]
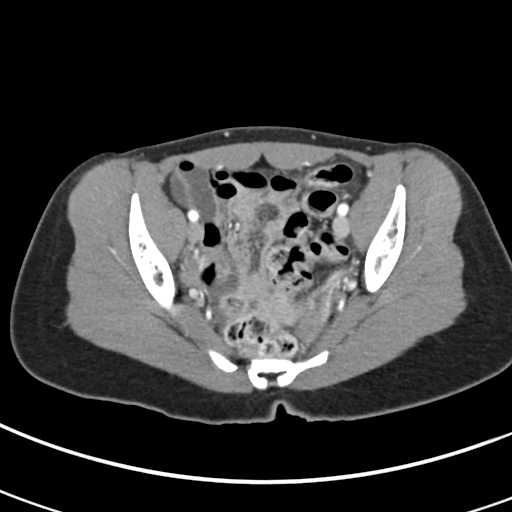
[im 29/80  soft-tissue]
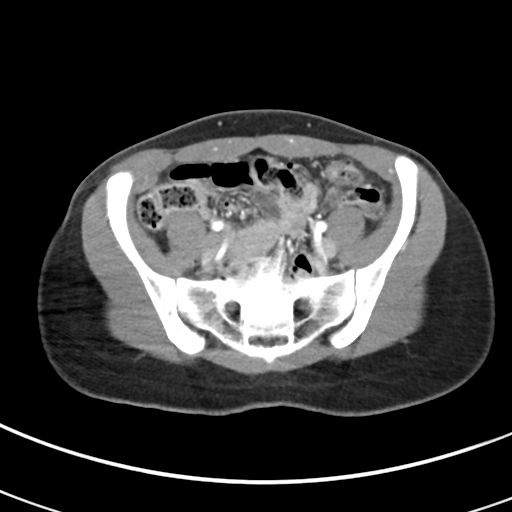
[im 33/80  soft-tissue]
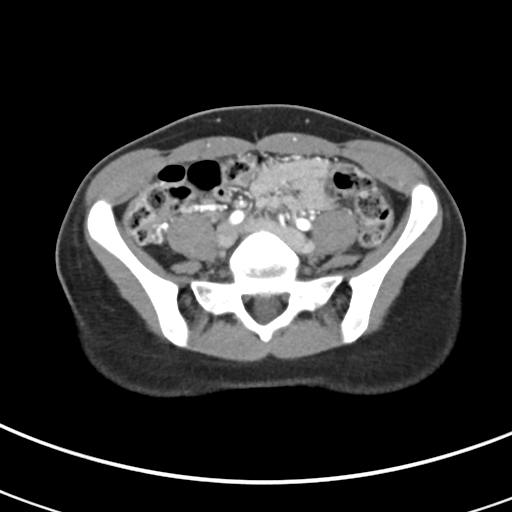
[im 40/80  soft-tissue]
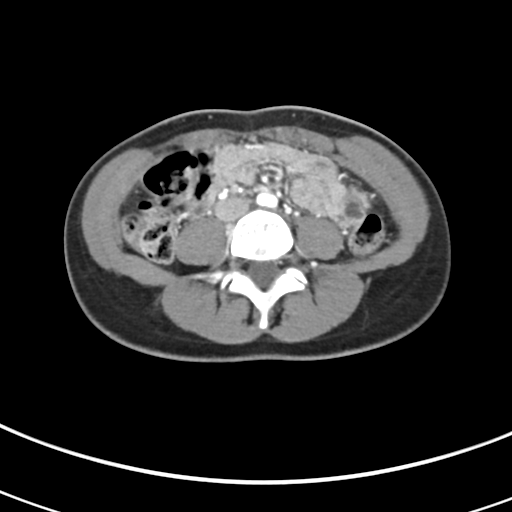
[im 47/80  soft-tissue]
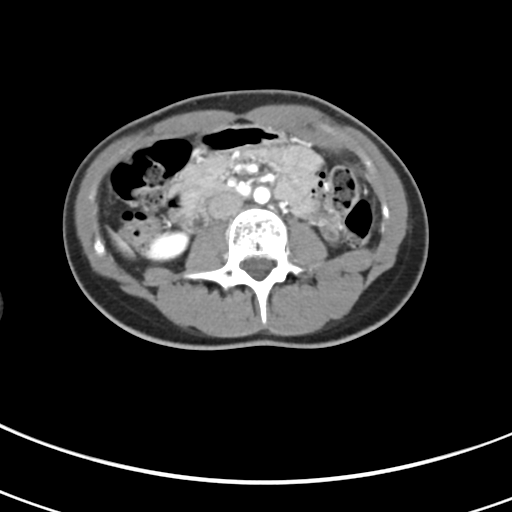
[im 51/80  soft-tissue]
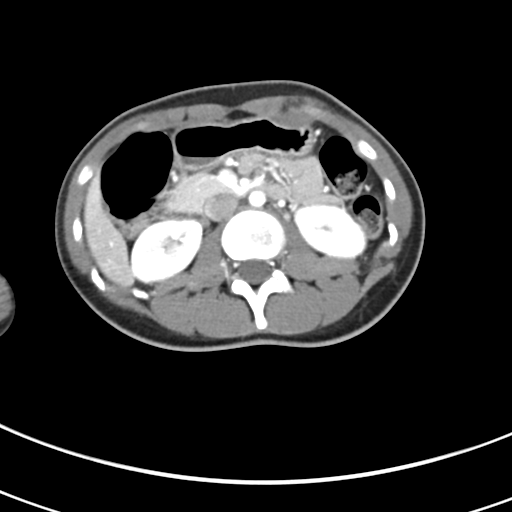
[im 51/80  bone]
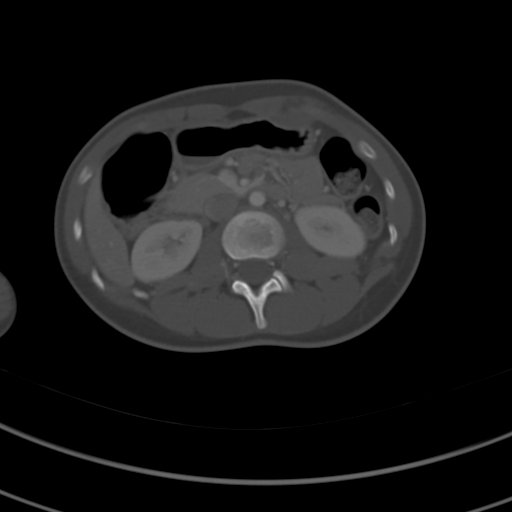
[im 58/80  soft-tissue]
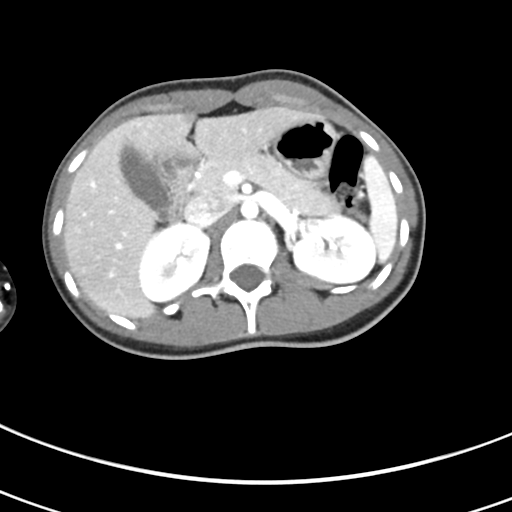
[im 62/80  soft-tissue]
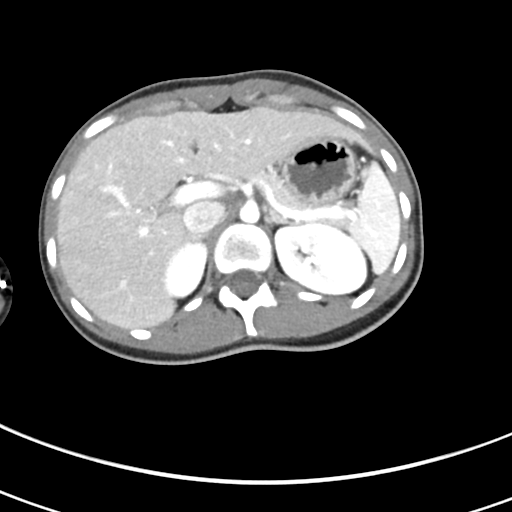
[im 69/80  soft-tissue]
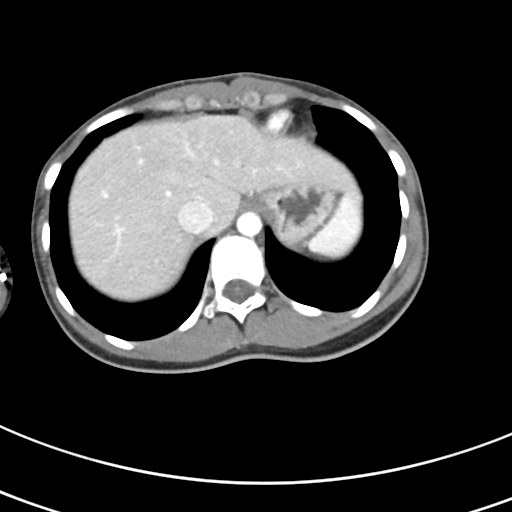
[im 76/80  soft-tissue]
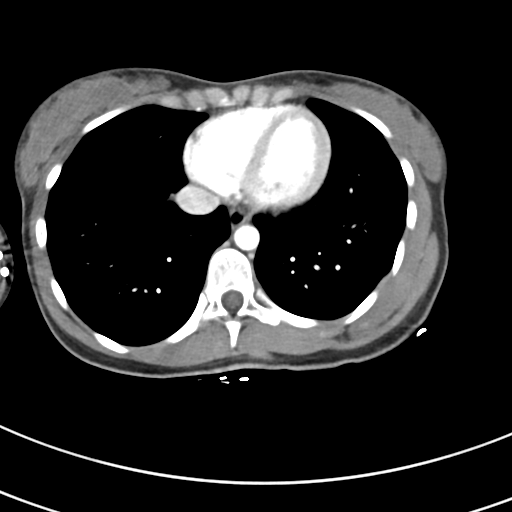

[Series 7: coronal soft tissue · coronal · 0.57mm/px · 3 of 68 slices shown]
[im 23/68  soft-tissue]
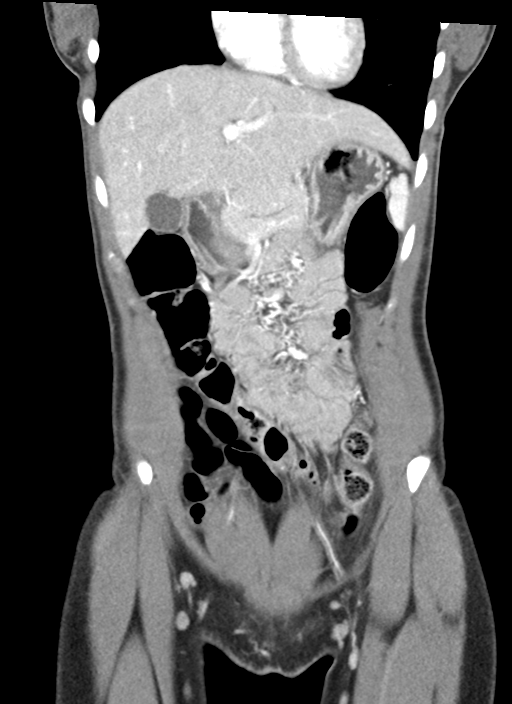
[im 30/68  soft-tissue]
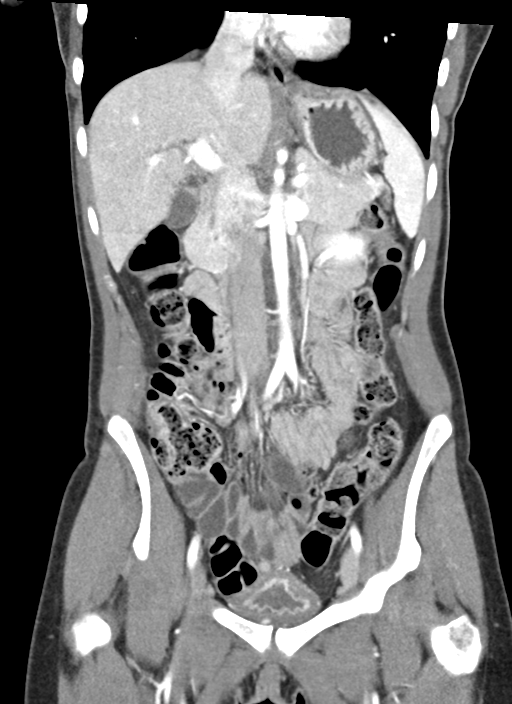
[im 38/68  soft-tissue]
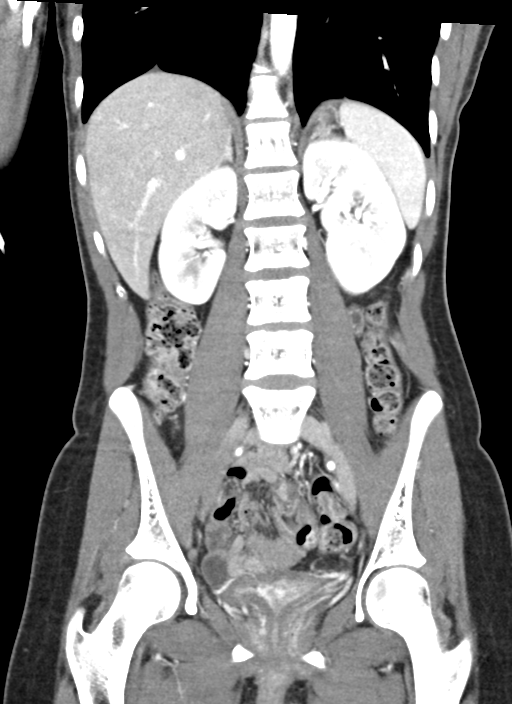

[16 of 46 positions shown; findings below may reference images not displayed]

FINDINGS: Lower chest: No acute abnormality.

Hepatobiliary: No focal liver abnormality is seen. No gallstones,
gallbladder wall thickening, or biliary dilatation.

Pancreas: Unremarkable. No pancreatic ductal dilatation or
surrounding inflammatory changes.

Spleen: Normal in size without focal abnormality.

Adrenals/Urinary Tract: Adrenal glands are within normal limits.
Kidneys demonstrate a normal enhancement pattern. No obstructive
changes are seen. Normal excretion is noted. The bladder is
decompressed.

Stomach/Bowel: The appendix is well visualized and within normal
limits. No obstructive or inflammatory changes of the colon or small
bowel are seen. The stomach is within normal limits.

Vascular/Lymphatic: No significant vascular findings are present. No
enlarged abdominal or pelvic lymph nodes.

Reproductive: Uterus and bilateral adnexa are unremarkable.

Other: Mild free fluid is noted within the pelvis. This is felt to
be physiologic in nature.

Musculoskeletal: No acute or significant osseous findings.
IMPRESSION: No acute abnormality noted.

## 2023-08-01 NOTE — L&D Delivery Note (Signed)
 Operative Delivery Note I was called urgently to the room about 5:42AM due to fetal heart rate decelerations By RN Marquita Cannick. Infant's HR was in the 60-70s with contractions and mother was pushing with RN. I reviewed the fetal heart rate tracing and given persistently low FHR without appropriate recovery and minimal variability.  I reviewed the need for urgent delivery with family.  Labor has been complicated by chorioamnionitis and ROM > 14 hours.   Verbal consent: obtained from patient, FOB was also present.  Risks and benefits discussed in detail.  Risks include, but are not limited to the risks of anesthesia, bleeding, infection, damage to maternal tissues, fetal cephalhematoma.  There is also the risk of inability to effect vaginal delivery of the head, or shoulder dystocia that cannot be resolved by established maneuvers, leading to the need for emergency cesarean section.  I placed the Kiwi Vacuum extraction at the flexion point 3cm from anterior fontanelle. I instructed the patient to push between contractions. I performed one pull and released the suction accidentally at the end. This was not a pop off. I then inflated to the appropriate pressure and with one pull and excellent maternal effort the infant delivered.  I identified and informed the room that there were nuchal cord x3, infant was delivered through and the left shoulder (anterior delivered easily). Infant was brought to maternal abdomen and cord was immediately cut and infant was brought to the warmer due to low FHR and lack of movement and NRP was started by the RN team.   At 5:52 AM a viable female was delivered via Vaginal, Vacuum Investment banker, operational).  Presentation: vertex; Position: Right Occiput,, Anterior; Station: +3.  APGAR: 1, 2, 3 at one/five/ten minutes; weight 6 lb 8.4 oz (2960 g).   Placenta status: Spontaneous, intact.   Cord: 3VC nuchal x3 delivered through with the following complications: Infant with poor tone and  apnea at delivery .    Cord pH: collected > 10 minutes after delivery from the placenta. Venous and Arterial were collected but were collected after placenta delivery. pH 6.80   Anesthesia:  Epidural Instruments: 10 Episiotomy: None Lacerations: 2nd degree with left labial extension- repaired in typical fashion Suture Repair: 3.0 vicryl Est. Blood Loss (mL):  150  Mom to postpartum.  Baby to Couplet care / Skin to Skin.  Suzen Octave [facility] 03/19/2024, 6:55 AM

## 2023-08-30 LAB — OB RESULTS CONSOLE RPR: RPR: NONREACTIVE

## 2023-08-30 LAB — HM PAP SMEAR

## 2023-08-30 LAB — HEPATITIS C ANTIBODY: HCV Ab: NEGATIVE

## 2023-08-30 LAB — OB RESULTS CONSOLE RUBELLA ANTIBODY, IGM: Rubella: IMMUNE

## 2023-08-30 LAB — OB RESULTS CONSOLE HIV ANTIBODY (ROUTINE TESTING): HIV: NONREACTIVE

## 2023-08-30 LAB — OB RESULTS CONSOLE HEPATITIS B SURFACE ANTIGEN: Hepatitis B Surface Ag: NEGATIVE

## 2023-11-20 ENCOUNTER — Emergency Department (HOSPITAL_COMMUNITY)
Admission: EM | Admit: 2023-11-20 | Discharge: 2023-11-21 | Disposition: A | Discharge status: Home / Self Care | Attending: Emergency Medicine | Admitting: Emergency Medicine

## 2023-11-20 ENCOUNTER — Other Ambulatory Visit: Payer: Self-pay

## 2023-11-20 ENCOUNTER — Encounter (HOSPITAL_COMMUNITY): Payer: Self-pay

## 2023-11-20 ENCOUNTER — Emergency Department (HOSPITAL_COMMUNITY)

## 2023-11-20 DIAGNOSIS — O26892 Other specified pregnancy related conditions, second trimester: Secondary | ICD-10-CM | POA: Diagnosis present

## 2023-11-20 DIAGNOSIS — R1032 Left lower quadrant pain: Secondary | ICD-10-CM | POA: Diagnosis not present

## 2023-11-20 DIAGNOSIS — O99012 Anemia complicating pregnancy, second trimester: Secondary | ICD-10-CM | POA: Diagnosis not present

## 2023-11-20 DIAGNOSIS — E876 Hypokalemia: Secondary | ICD-10-CM | POA: Diagnosis not present

## 2023-11-20 DIAGNOSIS — Z3A23 23 weeks gestation of pregnancy: Secondary | ICD-10-CM | POA: Diagnosis not present

## 2023-11-20 DIAGNOSIS — R1031 Right lower quadrant pain: Secondary | ICD-10-CM | POA: Diagnosis not present

## 2023-11-20 NOTE — Discharge Instructions (Addendum)
 Your ultrasound today was reassuring, please follow-up with your gynecologist.  There were no emergent causes of your abdominal pain noted today.  It may be secondary to some round ligament pain which is a common cause of abdominal pain in pregnancy.  There was no evidence of any miscarriage, your potassium was mildly low, we gave you some oral potassium, but please follow-up with your OB/GYN for a recheck of this value and to reassess if you continue to have any ongoing abdominal pain.  It was a pleasure taking care of you today.

## 2023-11-20 NOTE — ED Provider Notes (Signed)
 [place] EMERGENCY DEPARTMENT AT [facility] Provider Note   CSN: [id number] Arrival date & time: 11/20/23  2147     History  No chief complaint on file.   Hannah Mckay is a 23 y.o. female, [redacted] weeks pregnant, who presents to the ED secondary to lower abdominal pain, cramping, as well as vaginal bleeding, is been going on for the last day.  She states that she started having some lower abdominal pain, cramping, radiating to her back, and some blood, when she wiped when she urinated today, and became concerned.  She states that something like this happened last month, and she was told it is likely to stress.  States she has had a previous pregnancy, and it was fairly unremarkable, she has this pregnancy has been unremarkable 2, and she has been receiving her care by an OB/GYN, but is out of state, and initially is from [place] , is visiting here.  Denies any kind of chest pain, shortness of breath, vaginal discharge, or urinary symptoms.     Home Medications Prior to Admission medications   Medication Sig Start Date End Date Taking? Authorizing Provider  acetaminophen  (TYLENOL ) 500 MG tablet Take 500 mg by mouth every 6 (six) hours as needed for moderate pain.    [provider]  cholecalciferol (VITAMIN D3) 25 MCG (1000 UNIT) tablet Take 1,000 Units by mouth daily.    [provider]  Ferrous Sulfate (IRON PO) Take 1 tablet by mouth daily.    [provider]  methocarbamol  (ROBAXIN ) 500 MG tablet Take 2 tablets (1,000 mg total) by mouth 4 (four) times daily. Patient taking differently: Take 500 mg by mouth every 6 (six) hours as needed.  10/13/19   Geiple, Joshua, PA-C  naproxen  (NAPROSYN ) 375 MG tablet Take 1 tablet (375 mg total) by mouth 2 (two) times daily. Patient taking differently: Take 375 mg by mouth 2 (two) times daily as needed for moderate pain.  10/13/19   Lyna Sandhoff, PA-C  ondansetron  (ZOFRAN  ODT) 4 MG disintegrating tablet Take 1  tablet (4 mg total) by mouth every 6 (six) hours as needed for nausea or vomiting. 11/01/19   Dwain Giovanni, PA-C  vitamin B-12 (CYANOCOBALAMIN) 100 MCG tablet Take 100 mcg by mouth daily.    [provider]      Allergies    Bee venom and Cherry    Review of Systems   Review of Systems  Gastrointestinal:  Positive for abdominal pain.  Genitourinary:  Positive for vaginal bleeding. Negative for vaginal discharge.    Physical Exam Updated Vital Signs BP (!) 141/79 (BP Location: Right Arm)   Pulse 85   Temp 98.8 F (37.1 C) (Oral)   Resp 19   Ht 5\' 3"  (1.6 m)   Wt 41.3 kg   SpO2 99%   BMI 16.12 kg/m  Physical Exam Vitals and nursing note reviewed.  Constitutional:      General: She is not in acute distress.    Appearance: She is well-developed.  HENT:     Head: Normocephalic and atraumatic.  Eyes:     Conjunctiva/sclera: Conjunctivae normal.  Cardiovascular:     Rate and Rhythm: Normal rate and regular rhythm.     Heart sounds: No murmur heard. Pulmonary:     Effort: Pulmonary effort is normal. No respiratory distress.     Breath sounds: Normal breath sounds.  Abdominal:     Palpations: Abdomen is soft.     Tenderness: There is abdominal tenderness  in the right lower quadrant and left lower quadrant.     Comments: +gravid  Musculoskeletal:        General: No swelling.     Cervical back: Neck supple.  Skin:    General: Skin is warm and dry.     Capillary Refill: Capillary refill takes less than 2 seconds.  Neurological:     Mental Status: She is alert.  Psychiatric:        Mood and Affect: Mood normal.     ED Results / Procedures / Treatments   Labs (all labs ordered are listed, but only abnormal results are displayed) Labs Reviewed  CBC WITH DIFFERENTIAL/PLATELET  COMPREHENSIVE METABOLIC PANEL WITH GFR  HCG, QUANTITATIVE, PREGNANCY  URINALYSIS, ROUTINE W REFLEX MICROSCOPIC  RH IG WORKUP (INCLUDES ABO/RH)    EKG None  Radiology US  OB  Limited Result Date: 11/20/2023 CLINICAL DATA:  Pelvic pain EXAM: LIMITED OBSTETRIC ULTRASOUND COMPARISON:  None Available. FINDINGS: Number of Fetuses: 1 Heart Rate:  143 bpm Movement: Yes Presentation: Cephalic Placental Location: Anterior Previa: No Amniotic Fluid (Subjective):  Within normal limits. BPD: 5.62 cm 23 w  1 d MATERNAL FINDINGS: Cervix:  Appears closed. Uterus/Adnexae: No abnormality visualized. IMPRESSION: Single viable intrauterine gestation as above, no specific abnormality is seen This exam is performed on an emergent basis and does not comprehensively evaluate fetal size, dating, or anatomy; follow-up complete OB US  should be considered if further fetal assessment is warranted. Electronically Signed   By: Esmeralda Hedge M.D.   On: 11/20/2023 23:22    Procedures Procedures    Medications Ordered in ED Medications - No data to display  ED Course/ Medical Decision Making/ A&P                                 Medical Decision Making Patient is a 23 year old female, [redacted] weeks pregnant, here for abdominal pain, and some vaginal bleeding today.  She states that there was just a little bit of blood, when she urinated, we will obtain a ultrasound to evaluate the pregnancy, as well as the RhoGAM, labs to further evaluate given vaginal bleeding.  Amount and/or Complexity of Data Reviewed Labs: ordered. Radiology: ordered.    Details: Ultrasound unremarkable shows IUP no acute findings Discussion of management or test interpretation with external provider(s): Ultrasound is within normal limits, blood work is pending at this time, signed out to [place], [place], to follow-up on blood results.  Will likely need follow-up with gynecology, +/- RhoGAM, pending on results    Final Clinical Impression(s) / ED Diagnoses Final diagnoses:  Abdominal pain during pregnancy in second trimester    Rx / DC Orders ED Discharge Orders     None         Azlaan Isidore, Dwaine Gip, PA 11/20/23 2349     Mozell Arias, MD 11/22/23 [phone]

## 2023-11-20 NOTE — ED Triage Notes (Signed)
 Pt reports noticing bleeding when going to urinate. Pt denies pain, reports pressure in lower abdomen. Pt states she's [redacted] weeks pregnant. Pt denies any continued bleeding. Similar occurrence happened March 17th and patient was told it was due to stress.

## 2023-11-20 NOTE — ED Triage Notes (Signed)
 Fetal heart tones dopplered at 147bpm

## 2023-11-21 DIAGNOSIS — O26892 Other specified pregnancy related conditions, second trimester: Secondary | ICD-10-CM | POA: Diagnosis not present

## 2023-11-21 LAB — COMPREHENSIVE METABOLIC PANEL WITH GFR
ALT: 14 U/L (ref 0–44)
AST: 19 U/L (ref 15–41)
Albumin: 3.1 g/dL — ABNORMAL LOW (ref 3.5–5.0)
Alkaline Phosphatase: 42 U/L (ref 38–126)
Anion gap: 7 (ref 5–15)
BUN: <5 mg/dL — ABNORMAL LOW (ref 6–20)
CO2: 21 mmol/L — ABNORMAL LOW (ref 22–32)
Calcium: 8.7 mg/dL — ABNORMAL LOW (ref 8.9–10.3)
Chloride: 107 mmol/L (ref 98–111)
Creatinine, Ser: 0.39 mg/dL — ABNORMAL LOW (ref 0.44–1.00)
GFR, Estimated: >60 mL/min (ref >60)
Glucose, Bld: 70 mg/dL (ref 70–99)
Potassium: 3.2 mmol/L — ABNORMAL LOW (ref 3.5–5.1)
Sodium: 135 mmol/L (ref 135–145)
Total Bilirubin: 0.3 mg/dL (ref 0.0–1.2)
Total Protein: 7 g/dL (ref 6.5–8.1)

## 2023-11-21 LAB — CBC WITH DIFFERENTIAL/PLATELET
Abs Immature Granulocytes: 0.06 10*3/uL (ref 0.00–0.07)
Basophils Absolute: 0.1 10*3/uL (ref 0.0–0.1)
Basophils Relative: 1 %
Eosinophils Absolute: 0.1 10*3/uL (ref 0.0–0.5)
Eosinophils Relative: 2 %
HCT: 35.6 % — ABNORMAL LOW (ref 36.0–46.0)
Hemoglobin: 11.3 g/dL — ABNORMAL LOW (ref 12.0–15.0)
Immature Granulocytes: 1 %
Lymphocytes Relative: 25 %
Lymphs Abs: 2 10*3/uL (ref 0.7–4.0)
MCH: 28.3 pg (ref 26.0–34.0)
MCHC: 31.7 g/dL (ref 30.0–36.0)
MCV: 89.2 fL (ref 80.0–100.0)
Monocytes Absolute: 1 10*3/uL (ref 0.1–1.0)
Monocytes Relative: 12 %
Neutro Abs: 5 10*3/uL (ref 1.7–7.7)
Neutrophils Relative %: 59 %
Platelets: 282 10*3/uL (ref 150–400)
RBC: 3.99 MIL/uL (ref 3.87–5.11)
RDW: 13.8 % (ref 11.5–15.5)
WBC: 8.2 10*3/uL (ref 4.0–10.5)
nRBC: 0 % (ref 0.0–0.2)

## 2023-11-21 LAB — RH IG WORKUP (INCLUDES ABO/RH)
ABO/RH(D): O POS
Gestational Age(Wks): 23

## 2023-11-21 LAB — HCG, QUANTITATIVE, PREGNANCY: hCG, Beta Chain, Quant, S: 26259 m[IU]/mL — ABNORMAL HIGH (ref <5)

## 2023-11-21 MED ORDER — POTASSIUM CHLORIDE CRYS ER 20 MEQ PO TBCR
40.0000 meq | EXTENDED_RELEASE_TABLET | Freq: Once | ORAL | Status: AC
  Administered 2023-11-21: 40 meq via ORAL
  Filled 2023-11-21: qty 2

## 2023-11-21 NOTE — ED Provider Notes (Signed)
 Accepted handoff at shift change from [facility]. Please see prior provider note for more detail.   Briefly: Patient is 23 y.o.   DDX: concern for Out of town from [place] . [redacted] weeks pregnant. Some spotting, did have a concern for miscarriage. US  with viable pregnancy, normal heart rate.  Plan: I assessed her lab work, she has Rh+ antibodies and does not need RhoGAM, her hCG is appropriate for her gestational age, CBC with very mild anemia, hemoglobin 11.3, CMP notable for mild hypokalemia, potassium 3.2, otherwise overall unremarkable.  We will administer 1 dose of oral potassium in the emergency department, encourage close follow-up with her OB/GYN.  Some hypertension on arrival blood pressure 141/79 likely secondary to pain, improved on reassessment after pain improved.  Patient discharged in stable condition, return precautions given.     Stefan Edge 11/21/23 0223    Lindle Rhea, MD 11/21/23 [phone]

## 2023-12-11 ENCOUNTER — Inpatient Hospital Stay (HOSPITAL_COMMUNITY)
Admission: AD | Admit: 2023-12-11 | Discharge: 2023-12-11 | Disposition: A | Discharge status: Home / Self Care | Attending: Family Medicine | Admitting: Family Medicine

## 2023-12-11 ENCOUNTER — Encounter (HOSPITAL_COMMUNITY): Payer: Self-pay

## 2023-12-11 DIAGNOSIS — O368120 Decreased fetal movements, second trimester, not applicable or unspecified: Secondary | ICD-10-CM | POA: Diagnosis not present

## 2023-12-11 DIAGNOSIS — N858 Other specified noninflammatory disorders of uterus: Secondary | ICD-10-CM

## 2023-12-11 DIAGNOSIS — O26892 Other specified pregnancy related conditions, second trimester: Secondary | ICD-10-CM | POA: Diagnosis not present

## 2023-12-11 DIAGNOSIS — Z3A26 26 weeks gestation of pregnancy: Secondary | ICD-10-CM

## 2023-12-11 DIAGNOSIS — O4702 False labor before 37 completed weeks of gestation, second trimester: Secondary | ICD-10-CM | POA: Diagnosis not present

## 2023-12-11 DIAGNOSIS — R42 Dizziness and giddiness: Secondary | ICD-10-CM | POA: Diagnosis not present

## 2023-12-11 LAB — COMPREHENSIVE METABOLIC PANEL WITH GFR
ALT: 15 U/L (ref 0–44)
AST: 24 U/L (ref 15–41)
Albumin: 2.8 g/dL — ABNORMAL LOW (ref 3.5–5.0)
Alkaline Phosphatase: 49 U/L (ref 38–126)
Anion gap: 10 (ref 5–15)
BUN: 7 mg/dL (ref 6–20)
CO2: 19 mmol/L — ABNORMAL LOW (ref 22–32)
Calcium: 8.9 mg/dL (ref 8.9–10.3)
Chloride: 104 mmol/L (ref 98–111)
Creatinine, Ser: 0.44 mg/dL (ref 0.44–1.00)
GFR, Estimated: >60 mL/min (ref >60)
Glucose, Bld: 110 mg/dL — ABNORMAL HIGH (ref 70–99)
Potassium: 3.6 mmol/L (ref 3.5–5.1)
Sodium: 133 mmol/L — ABNORMAL LOW (ref 135–145)
Total Bilirubin: 0.3 mg/dL (ref 0.0–1.2)
Total Protein: 6.5 g/dL (ref 6.5–8.1)

## 2023-12-11 LAB — CBC
HCT: 31.7 % — ABNORMAL LOW (ref 36.0–46.0)
Hemoglobin: 10.3 g/dL — ABNORMAL LOW (ref 12.0–15.0)
MCH: 27.9 pg (ref 26.0–34.0)
MCHC: 32.5 g/dL (ref 30.0–36.0)
MCV: 85.9 fL (ref 80.0–100.0)
Platelets: 298 10*3/uL (ref 150–400)
RBC: 3.69 MIL/uL — ABNORMAL LOW (ref 3.87–5.11)
RDW: 14.2 % (ref 11.5–15.5)
WBC: 10 10*3/uL (ref 4.0–10.5)
nRBC: 0 % (ref 0.0–0.2)

## 2023-12-11 LAB — FETAL FIBRONECTIN: Fetal Fibronectin: NEGATIVE

## 2023-12-11 LAB — LIPASE, BLOOD: Lipase: 33 U/L (ref 11–51)

## 2023-12-11 MED ORDER — NIFEDIPINE 10 MG PO CAPS
10.0000 mg | ORAL_CAPSULE | ORAL | Status: DC | PRN
  Administered 2023-12-11: 10 mg via ORAL
  Filled 2023-12-11 (×2): qty 1

## 2023-12-11 NOTE — MAU Provider Note (Signed)
 Chief Complaint:  Decreased Fetal Movement   Event Date/Time   First Provider Initiated Contact with Patient 12/11/23 2139     HPI: Hannah Mckay is a 23 y.o. G1P0 at 56w0dwho presents to maternity admissions reporting no fetal movement.   Vomited yesterday and today, 3 times total. . She denies LOF, vaginal bleeding,  urinary symptoms, diarrhea, constipation or fever/chills.    Other This is a new problem. The current episode started yesterday. The problem has been unchanged. Pertinent negatives include no abdominal pain (some pressure, occasional cramping), coughing, fever or myalgias. Nothing aggravates the symptoms. She has tried nothing for the symptoms.   RN Note: Sallie Rossiter is a 23 y.o. at Unknown here in MAU reporting: Reports decreased fetal movement since yesterday.  Vomited once yesterday and 2 times today and has felt dizzy since then.  Denies vaginal bleeding, leaking of fluid, or abdominal pain.  Past Medical History: Past Medical History:  Diagnosis Date   Iron deficiency    Vitamin D deficiency     Past obstetric history: OB History  Gravida Para Term Preterm AB Living  1       SAB IAB Ectopic Multiple Live Births          # Outcome Date GA Lbr Len/2nd Weight Sex Type Anes PTL Lv  1 Current             Past Surgical History: Past Surgical History:  Procedure Laterality Date   EYE SURGERY      Family History: Family History  Problem Relation Age of Onset   Diabetes Mother    Asthma Mother    Healthy Father     Social History: Social History   Tobacco Use   Smoking status: Never   Smokeless tobacco: Never  Vaping Use   Vaping status: Every Day  Substance Use Topics   Alcohol use: Not Currently    Comment: rare   Drug use: Never    Allergies:  Allergies  Allergen Reactions   Bee Venom Anaphylaxis   Cherry Itching and Rash    Meds:  Medications Prior to Admission  Medication Sig Dispense Refill Last Dose/Taking   acetaminophen   (TYLENOL ) 500 MG tablet Take 500 mg by mouth every 6 (six) hours as needed for moderate pain.      cholecalciferol (VITAMIN D3) 25 MCG (1000 UNIT) tablet Take 1,000 Units by mouth daily.      Ferrous Sulfate (IRON PO) Take 1 tablet by mouth daily.      methocarbamol  (ROBAXIN ) 500 MG tablet Take 2 tablets (1,000 mg total) by mouth 4 (four) times daily. (Patient taking differently: Take 500 mg by mouth every 6 (six) hours as needed. ) 30 tablet 0    naproxen  (NAPROSYN ) 375 MG tablet Take 1 tablet (375 mg total) by mouth 2 (two) times daily. (Patient taking differently: Take 375 mg by mouth 2 (two) times daily as needed for moderate pain. ) 20 tablet 0    ondansetron  (ZOFRAN  ODT) 4 MG disintegrating tablet Take 1 tablet (4 mg total) by mouth every 6 (six) hours as needed for nausea or vomiting. 20 tablet 0    vitamin B-12 (CYANOCOBALAMIN) 100 MCG tablet Take 100 mcg by mouth daily.       I have reviewed patient's Past Medical Hx, Surgical Hx, Family Hx, Social Hx, medications and allergies.   ROS:  Review of Systems  Constitutional:  Negative for fever.  Respiratory:  Negative for cough.   Gastrointestinal:  Negative for  abdominal pain (some pressure, occasional cramping).  Musculoskeletal:  Negative for myalgias.   Other systems negative  Physical Exam  Patient Vitals for the past 24 hrs:  BP Temp Temp src Pulse Resp SpO2 Height Weight  12/11/23 2029 123/76 98.6 F (37 C) Oral 86 16 100 % -- --  12/11/23 2013 -- -- -- -- -- -- 5\' 3"  (1.6 m) 41.7 kg   Constitutional: Well-developed, well-nourished female in no acute distress.  Cardiovascular: normal rate and rhythm Respiratory: normal effort, clear to auscultation bilaterally GI: Abd soft, non-tender, gravid appropriate for gestational age.   No rebound or guarding. MS: Extremities nontender, no edema, normal ROM Neurologic: Alert and oriented x 4.  PELVIC EXAM:   Dilation: Closed Effacement (%): Thick Station: Ballotable Exam by::  Holmes Lusher CNM Fetal fibronectin sent  FHT:  Baseline 145 , moderate variability, small accelerations present, no decelerations Contractions: Intermittent uterine irritability Audible fetal movement throughout (does not feel it)   Labs: Results for orders placed or performed during the hospital encounter of 12/11/23 (from the past 24 hours)  CBC     Status: Abnormal   Collection Time: 12/11/23  9:01 PM  Result Value Ref Range   WBC 10.0 4.0 - 10.5 K/uL   RBC 3.69 (L) 3.87 - 5.11 MIL/uL   Hemoglobin 10.3 (L) 12.0 - 15.0 g/dL   HCT 60.4 (L) 54.0 - 98.1 %   MCV 85.9 80.0 - 100.0 fL   MCH 27.9 26.0 - 34.0 pg   MCHC 32.5 30.0 - 36.0 g/dL   RDW 19.1 47.8 - 29.5 %   Platelets 298 150 - 400 K/uL   nRBC 0.0 0.0 - 0.2 %  Comprehensive metabolic panel     Status: Abnormal   Collection Time: 12/11/23  9:01 PM  Result Value Ref Range   Sodium 133 (L) 135 - 145 mmol/L   Potassium 3.6 3.5 - 5.1 mmol/L   Chloride 104 98 - 111 mmol/L   CO2 19 (L) 22 - 32 mmol/L   Glucose, Bld 110 (H) 70 - 99 mg/dL   BUN 7 6 - 20 mg/dL   Creatinine, Ser 6.21 0.44 - 1.00 mg/dL   Calcium 8.9 8.9 - 30.8 mg/dL   Total Protein 6.5 6.5 - 8.1 g/dL   Albumin 2.8 (L) 3.5 - 5.0 g/dL   AST 24 15 - 41 U/L   ALT 15 0 - 44 U/L   Alkaline Phosphatase 49 38 - 126 U/L   Total Bilirubin 0.3 0.0 - 1.2 mg/dL   GFR, Estimated >65 >78 mL/min   Anion gap 10 5 - 15  Lipase, blood     Status: None   Collection Time: 12/11/23  9:01 PM  Result Value Ref Range   Lipase 33 11 - 51 U/L  Fetal fibronectin     Status: None   Collection Time: 12/11/23  9:55 PM  Result Value Ref Range   Fetal Fibronectin NEGATIVE NEGATIVE    --/--/O POS Performed at [facility], [address] [place], [place] [address]  (04/23 0030)  Imaging:    MAU Course/MDM: I have reviewed the triage vital signs and the nursing notes.   Pertinent labs & imaging results that were available during my care of the patient were  reviewed by me and considered in my medical decision making (see chart for details).      I have reviewed her medical records including past results, notes and treatments.   I have ordered labs  and reviewed results. UA clear, FFn Negative  Explained this implies very little chance of preterm delivery in the next two weeks NST reviewed  Treatments in MAU included EFM, Procardia series which did stop irritability.    Assessment: Single IUP at [redacted]w[redacted]d Decreased fetal movement Preterm Uterine Irritability Late transfer of prenatal care  Plan: Discharge home Preterm Labor precautions and fetal kick counts Follow up in Office for prenatal visits and recheck Encouraged to return if she develops worsening of symptoms, increase in pain, fever, or other concerning symptoms.   Pt stable at time of discharge.  Holmes Lusher CNM, MSN Certified Nurse-Midwife 12/11/2023 9:39 PM

## 2023-12-11 NOTE — MAU Note (Signed)
..  Hannah Mckay is a 23 y.o. at Unknown here in MAU reporting: Reports decreased fetal movement since yesterday.  Vomited once yesterday and 2 times today and has felt dizzy since then.  Denies vaginal bleeding, leaking of fluid, or abdominal pain.   Pain score: 0/10 Vitals:   12/11/23 2029  BP: 123/76  Pulse: 86  Resp: 16  Temp: 98.6 F (37 C)  SpO2: 100%     FHT:156 Lab orders placed from triage:  UA, patient unable to void.

## 2023-12-11 NOTE — MAU Provider Note (Incomplete)
 Chief Complaint:  Decreased Fetal Movement   Event Date/Time   First Provider Initiated Contact with Patient 12/11/23 2031     HPI  HPI: Hannah Mckay is a 23 y.o. G1P0 at 21w0dwho presents to maternity admissions reporting ***. She reports good fetal movement, denies LOF, vaginal bleeding, vaginal itching/burning, urinary symptoms, h/a, dizziness, n/v, diarrhea, constipation or fever/chills.  She denies headache, visual changes or RUQ abdominal pain.   Past Medical History: Past Medical History:  Diagnosis Date   Iron deficiency    Vitamin D deficiency     Past obstetric history: OB History  Gravida Para Term Preterm AB Living  1       SAB IAB Ectopic Multiple Live Births          # Outcome Date GA Lbr Len/2nd Weight Sex Type Anes PTL Lv  1 Current             Past Surgical History: Past Surgical History:  Procedure Laterality Date   EYE SURGERY      Family History: Family History  Problem Relation Age of Onset   Diabetes Mother    Asthma Mother    Healthy Father     Social History: Social History   Tobacco Use   Smoking status: Never   Smokeless tobacco: Never  Vaping Use   Vaping status: Every Day  Substance Use Topics   Alcohol use: Not Currently    Comment: rare   Drug use: Never    Allergies:  Allergies  Allergen Reactions   Bee Venom Anaphylaxis   Cherry Itching and Rash    Meds:  Medications Prior to Admission  Medication Sig Dispense Refill Last Dose/Taking   acetaminophen  (TYLENOL ) 500 MG tablet Take 500 mg by mouth every 6 (six) hours as needed for moderate pain.      cholecalciferol (VITAMIN D3) 25 MCG (1000 UNIT) tablet Take 1,000 Units by mouth daily.      Ferrous Sulfate (IRON PO) Take 1 tablet by mouth daily.      methocarbamol  (ROBAXIN ) 500 MG tablet Take 2 tablets (1,000 mg total) by mouth 4 (four) times daily. (Patient taking differently: Take 500 mg by mouth every 6 (six) hours as needed. ) 30 tablet 0    naproxen  (NAPROSYN )  375 MG tablet Take 1 tablet (375 mg total) by mouth 2 (two) times daily. (Patient taking differently: Take 375 mg by mouth 2 (two) times daily as needed for moderate pain. ) 20 tablet 0    ondansetron  (ZOFRAN  ODT) 4 MG disintegrating tablet Take 1 tablet (4 mg total) by mouth every 6 (six) hours as needed for nausea or vomiting. 20 tablet 0    vitamin B-12 (CYANOCOBALAMIN) 100 MCG tablet Take 100 mcg by mouth daily.       I have reviewed patient's Past Medical Hx, Surgical Hx, Family Hx, Social Hx, medications and allergies.   ROS:  Review of Systems Other systems negative  Physical Exam  Patient Vitals for the past 24 hrs:  BP Temp Temp src Pulse Resp SpO2 Height Weight  12/11/23 2029 123/76 98.6 F (37 C) Oral 86 16 100 % -- --  12/11/23 2013 -- -- -- -- -- -- 5\' 3"  (1.6 m) 41.7 kg   Constitutional: Well-developed, well-nourished female in no acute distress.  Cardiovascular: normal rate and rhythm Respiratory: normal effort, clear to auscultation bilaterally GI: Abd soft, non-tender, gravid appropriate for gestational age.   No rebound or guarding. MS: Extremities nontender, no edema, normal ROM  Neurologic: Alert and oriented x 4.  GU: Neg CVAT.  PELVIC EXAM: Cervix pink, visually closed, without lesion, scant white creamy discharge, vaginal walls and external genitalia normal Bimanual exam: Cervix firm, posterior, neg CMT, uterus nontender, Fundal Height consistent with dates, adnexa without tenderness, enlargement, or mass     FHT:  Baseline *** , moderate variability, accelerations present, no decelerations Contractions: q *** mins Irregular  Rare   Labs: No results found for this or any previous visit (from the past 24 hours). --/--/O POS Performed at [facility], [address], [place], [place] [address]  [phone]/23 0030)  Imaging:  US  OB Limited Result Date: 11/20/2023 CLINICAL DATA:  Pelvic pain EXAM: LIMITED OBSTETRIC ULTRASOUND COMPARISON:  None  Available. FINDINGS: Number of Fetuses: 1 Heart Rate:  143 bpm Movement: Yes Presentation: Cephalic Placental Location: Anterior Previa: No Amniotic Fluid (Subjective):  Within normal limits. BPD: 5.62 cm 23 w  1 d MATERNAL FINDINGS: Cervix:  Appears closed. Uterus/Adnexae: No abnormality visualized. IMPRESSION: Single viable intrauterine gestation as above, no specific abnormality is seen This exam is performed on an emergent basis and does not comprehensively evaluate fetal size, dating, or anatomy; follow-up complete OB US  should be considered if further fetal assessment is warranted. Electronically Signed   By: Esmeralda Hedge M.D.   On: 11/20/2023 23:22    MAU Course/MDM: I have reviewed the triage vital signs and the nursing notes.   Pertinent labs & imaging results that were available during my care of the patient were reviewed by me and considered in my medical decision making (see chart for details).      I have reviewed her medical records including past results, notes and treatments.   I have ordered labs and reviewed results.  NST reviewed Consult *** with presentation, exam findings and test results.  Treatments in MAU included ***.    Assessment: No diagnosis found.  Plan: Discharge home Labor precautions and fetal kick counts Follow up in Office for prenatal visits and recheck   Pt stable at time of discharge.  Holmes Lusher CNM, MSN Certified Nurse-Midwife 12/11/2023 8:31 PM

## 2024-01-04 ENCOUNTER — Encounter (HOSPITAL_COMMUNITY): Payer: Self-pay | Admitting: Obstetrics & Gynecology

## 2024-01-04 ENCOUNTER — Inpatient Hospital Stay (HOSPITAL_COMMUNITY)
Admission: AD | Admit: 2024-01-04 | Discharge: 2024-01-04 | Disposition: A | Discharge status: Home / Self Care | Attending: Obstetrics & Gynecology | Admitting: Obstetrics & Gynecology

## 2024-01-04 ENCOUNTER — Other Ambulatory Visit: Payer: Self-pay

## 2024-01-04 DIAGNOSIS — R0602 Shortness of breath: Secondary | ICD-10-CM | POA: Insufficient documentation

## 2024-01-04 DIAGNOSIS — Z3A29 29 weeks gestation of pregnancy: Secondary | ICD-10-CM

## 2024-01-04 DIAGNOSIS — O26893 Other specified pregnancy related conditions, third trimester: Secondary | ICD-10-CM | POA: Diagnosis not present

## 2024-01-04 DIAGNOSIS — O368130 Decreased fetal movements, third trimester, not applicable or unspecified: Secondary | ICD-10-CM

## 2024-01-04 DIAGNOSIS — O0933 Supervision of pregnancy with insufficient antenatal care, third trimester: Secondary | ICD-10-CM | POA: Insufficient documentation

## 2024-01-04 DIAGNOSIS — O4703 False labor before 37 completed weeks of gestation, third trimester: Secondary | ICD-10-CM | POA: Insufficient documentation

## 2024-01-04 DIAGNOSIS — Z711 Person with feared health complaint in whom no diagnosis is made: Secondary | ICD-10-CM

## 2024-01-04 NOTE — MAU Note (Signed)
 Hannah Mckay is a 23 y.o. at [redacted]w[redacted]d here in MAU reporting: she having decreased FM, reports no movement since yesterday evening.  Denies VB or LOF. Pt reporting she has also been short of breath for the past few days, "like there is not enough oxygen in the room."  LMP: NA Onset of complaint: today Pain score: 4 Vitals:   01/04/24 1453  BP: 110/77  Pulse: (!) 111  Resp: 18  Temp: 98.3 F (36.8 C)  SpO2: 97%     FHT: 139 bpm  Lab orders placed from triage: None

## 2024-01-04 NOTE — MAU Provider Note (Addendum)
 MAU Provider Note  Chief Complaint: Decreased Fetal Movement and Shortness of Breath  SUBJECTIVE HPI: Hannah Mckay is a 23 y.o. G1P0 at [redacted]w[redacted]d by midtrimester ultrasound who presents to maternity admissions reporting decreased fetal movements since yesterday evening. Also reporting SOB "like there is not enough oxygen in the room".   Intermittent sharp pelvic pains, none now. Intermittent tightening of abdomen, believes these are braxton hicks.   Pregnancy c/b limited prenatal care, just moved back to  from [place]  in April.    Receives [facility] - first appoint scheduled for 9/16.  HPI  Past Medical History:  Diagnosis Date   Iron deficiency    Vitamin D deficiency    Past Surgical History:  Procedure Laterality Date   EYE SURGERY     Social History   Socioeconomic History   Marital status: Single    Spouse name: Not on file   Number of children: Not on file   Years of education: Not on file   Highest education level: Not on file  Occupational History   Not on file  Tobacco Use   Smoking status: Never   Smokeless tobacco: Never  Vaping Use   Vaping status: Every Day  Substance and Sexual Activity   Alcohol use: Not Currently    Comment: rare   Drug use: Never   Sexual activity: Yes    Comment: no protection  Other Topics Concern   Not on file  Social History Narrative   Not on file   Social Drivers of Health   Financial Resource Strain: Not on file  Food Insecurity: Not on file  Transportation Needs: Not on file  Physical Activity: Not on file  Stress: Not on file  Social Connections: Not on file  Intimate Partner Violence: Not on file   No current facility-administered medications on file prior to encounter.   Current Outpatient Medications on File Prior to Encounter  Medication Sig Dispense Refill   Prenatal Vit-Fe Fumarate-FA (PRENATAL MULTIVITAMIN) TABS tablet Take 1 tablet by mouth daily at 12 noon.     acetaminophen   (TYLENOL ) 500 MG tablet Take 500 mg by mouth every 6 (six) hours as needed for moderate pain.     cholecalciferol (VITAMIN D3) 25 MCG (1000 UNIT) tablet Take 1,000 Units by mouth daily.     Ferrous Sulfate (IRON PO) Take 1 tablet by mouth daily.     ondansetron  (ZOFRAN  ODT) 4 MG disintegrating tablet Take 1 tablet (4 mg total) by mouth every 6 (six) hours as needed for nausea or vomiting. 20 tablet 0   vitamin B-12 (CYANOCOBALAMIN) 100 MCG tablet Take 100 mcg by mouth daily.     Allergies  Allergen Reactions   Bee Venom Anaphylaxis   Cherry Itching and Rash    ROS:  Pertinent positives/negatives listed above.  I have reviewed patient's Past Medical Hx, Surgical Hx, Family Hx, Social Hx, medications and allergies.   Physical Exam  Patient Vitals for the past 24 hrs:  BP Temp Temp src Pulse Resp SpO2 Height Weight  01/04/24 1507 120/80 -- -- (!) 110 -- 98 % -- --  01/04/24 1453 110/77 98.3 F (36.8 C) Oral (!) 111 18 97 % -- --  01/04/24 1449 -- -- -- -- -- -- 5\' 3"  (1.6 m) 43.2 kg   Constitutional: Well-developed, well-nourished female in no acute distress  Cardiovascular: normal rate Respiratory: normal effort GI: Abd soft, non-tender MS: Extremities nontender, no edema, normal ROM Neurologic: Alert and oriented x  4  GU: Neg CVAT.  FHT:  Baseline 135, moderate variability, accelerations present, no decelerations Contractions: None  Limited bedside US : Active fetus, deepest vertical pocket 9.8 cm, FHR 138, vertex, anterior placenta.   LAB RESULTS No results found for this or any previous visit (from the past 24 hours).  --/--/O POS Performed at [facility], [address], [place], [place] [address]  (04/23 0030)  IMAGING No results found.  MAU Management/MDM: Orders Placed This Encounter  Procedures   Discharge patient Discharge disposition: 01-Home or Self Care; Discharge patient date: 01/04/2024    No orders of the defined types were placed in  this encounter.  Available prenatal records reviewed.  ASSESSMENT 1. [redacted] weeks gestation of pregnancy   2. Decreased fetal movements in third trimester, single or unspecified fetus   3. Worried well   Increased fetal movements during bedside US  and after. Reassuring NST.  Declined pain or SOB.  Comfortable going home.   Ample time provided for questions, comments and concerns from patient and partner.  PLAN Discharge home with strict return precautions. Continue prenatal care as scheduled.  Allergies as of 01/04/2024       Reactions   Bee Venom Anaphylaxis   Cherry Itching, Rash        Medication List     TAKE these medications    acetaminophen  500 MG tablet Commonly known as: TYLENOL  Take 500 mg by mouth every 6 (six) hours as needed for moderate pain.   cholecalciferol 25 MCG (1000 UNIT) tablet Commonly known as: VITAMIN D3 Take 1,000 Units by mouth daily.   IRON PO Take 1 tablet by mouth daily.   ondansetron  4 MG disintegrating tablet Commonly known as: Zofran  ODT Take 1 tablet (4 mg total) by mouth every 6 (six) hours as needed for nausea or vomiting.   prenatal multivitamin Tabs tablet Take 1 tablet by mouth daily at 12 noon.   vitamin B-12 100 MCG tablet Commonly known as: CYANOCOBALAMIN Take 100 mcg by mouth daily.        Follow-up Information     Obgyn, [employer]. Go to.   Why: Continue prenatal care as scheduled Contact information: [address] [place] [address] [phone]                 [place], MD FMOB Fellow, Faculty practice [facility], [facility] for [facility]  01/04/2024  4:28 PM

## 2024-01-22 ENCOUNTER — Encounter: Payer: Self-pay | Admitting: Obstetrics

## 2024-01-22 ENCOUNTER — Other Ambulatory Visit (HOSPITAL_COMMUNITY)
Admission: RE | Admit: 2024-01-22 | Discharge: 2024-01-22 | Disposition: A | Discharge status: Home / Self Care | Source: Ambulatory Visit | Attending: Obstetrics | Admitting: Obstetrics

## 2024-01-22 ENCOUNTER — Ambulatory Visit (INDEPENDENT_AMBULATORY_CARE_PROVIDER_SITE_OTHER): Admitting: Obstetrics

## 2024-01-22 VITALS — BP 118/84 | HR 98 | Wt 98.0 lb

## 2024-01-22 DIAGNOSIS — Z1331 Encounter for screening for depression: Secondary | ICD-10-CM | POA: Diagnosis not present

## 2024-01-22 DIAGNOSIS — Z3A32 32 weeks gestation of pregnancy: Secondary | ICD-10-CM | POA: Diagnosis not present

## 2024-01-22 DIAGNOSIS — Z3403 Encounter for supervision of normal first pregnancy, third trimester: Secondary | ICD-10-CM | POA: Diagnosis not present

## 2024-01-22 DIAGNOSIS — O0933 Supervision of pregnancy with insufficient antenatal care, third trimester: Secondary | ICD-10-CM | POA: Diagnosis not present

## 2024-01-22 NOTE — Progress Notes (Signed)
 Needs 2hr glucola and all 28wk blood work. Denies any issues today.  Last pap 08/30/2023 WNL

## 2024-01-22 NOTE — Progress Notes (Signed)
 Subjective:    Hannah Mckay is being seen today for her first obstetrical visit.  This is not a planned pregnancy. She is at [redacted]w[redacted]d gestation. Her obstetrical history is significant for none. Relationship with FOB: significant other, not living together. Patient does intend to breast feed. Pregnancy history fully reviewed.  The information documented in the HPI was reviewed and verified.  Menstrual History: OB History     Gravida  1   Para      Term      Preterm      AB      Living         SAB      IAB      Ectopic      Multiple      Live Births               No LMP recorded. Patient is pregnant.    Past Medical History:  Diagnosis Date   Iron deficiency    Vitamin D deficiency     Past Surgical History:  Procedure Laterality Date   EYE SURGERY      (Not in a hospital admission)  Allergies  Allergen Reactions   Bee Venom Anaphylaxis   Cherry Itching and Rash    Social History   Tobacco Use   Smoking status: Never   Smokeless tobacco: Never  Substance Use Topics   Alcohol use: Not Currently    Comment: rare    Family History  Problem Relation Age of Onset   Diabetes Mother    Asthma Mother    Healthy Father      Review of Systems Constitutional: negative for weight loss Gastrointestinal: negative for vomiting Genitourinary:negative for genital lesions and vaginal discharge and dysuria Musculoskeletal:negative for back pain Behavioral/Psych: negative for abusive relationship, depression, illegal drug usage and tobacco use    Objective:    BP 118/84   Pulse 98   Wt 98 lb (44.5 kg)   BMI 17.36 kg/m  General Appearance:    Alert, cooperative, no distress, appears stated age  Head:    Normocephalic, without obvious abnormality, atraumatic  Eyes:    PERRL, conjunctiva/corneas clear, EOM's intact, fundi    benign, both eyes  Ears:    Normal TM's and external ear canals, both ears  Nose:   Nares normal, septum midline, mucosa  normal, no drainage    or sinus tenderness  Throat:   Lips, mucosa, and tongue normal; teeth and gums normal  Neck:   Supple, symmetrical, trachea midline, no adenopathy;    thyroid:  no enlargement/tenderness/nodules; no carotid   bruit or JVD  Back:     Symmetric, no curvature, ROM normal, no CVA tenderness  Lungs:     Clear to auscultation bilaterally, respirations unlabored  Chest Wall:    No tenderness or deformity   Heart:    Regular rate and rhythm, S1 and S2 normal, no murmur, rub   or gallop  Breast Exam:    No tenderness, masses, or nipple abnormality  Abdomen:     Soft, non-tender, bowel sounds active all four quadrants,    no masses, no organomegaly  Genitalia:    Normal female without lesion, discharge or tenderness  Extremities:   Extremities normal, atraumatic, no cyanosis or edema  Pulses:   2+ and symmetric all extremities  Skin:   Skin color, texture, turgor normal, no rashes or lesions  Lymph nodes:   Cervical, supraclavicular, and axillary nodes normal  Neurologic:  CNII-XII intact, normal strength, sensation and reflexes    throughout      Lab Review Urine pregnancy test Labs reviewed yes Radiologic studies reviewed yes  Assessment:    Pregnancy at [redacted]w[redacted]d weeks    Plan:    1. [redacted] weeks gestation of pregnancy (Primary) Rx: - HgB A1c - Cervicovaginal ancillary only( [place]) - RPR - HIV antibody (with reflex) - CBC   Prenatal vitamins.  Counseling provided regarding continued use of seat belts, cessation of alcohol consumption, smoking or use of illicit drugs; infection precautions i.e., influenza/TDAP immunizations, toxoplasmosis,CMV, parvovirus, listeria and varicella; workplace safety, exercise during pregnancy; routine dental care, safe medications, sexual activity, hot tubs, saunas, pools, travel, caffeine use, fish and methlymercury, potential toxins, hair treatments, varicose veins Weight gain recommendations per IOM guidelines reviewed:  underweight/BMI< 18.5--> gain 28 - 40 lbs; normal weight/BMI 18.5 - 24.9--> gain 25 - 35 lbs; overweight/BMI 25 - 29.9--> gain 15 - 25 lbs; obese/BMI >30->gain  11 - 20 lbs Problem list reviewed and updated. FIRST/CF mutation testing/NIPT/QUAD SCREEN/fragile X/Ashkenazi Jewish population testing/Spinal muscular atrophy discussed: requested. Role of ultrasound in pregnancy discussed; fetal survey: requested. Amniocentesis discussed: not indicated.   Orders Placed This Encounter  Procedures   HgB A1c   RPR   HIV antibody (with reflex)   CBC    Follow up in 2 weeks.  I have spent a total of 20 minutes of face-to-face time, excluding clinical staff time, reviewing notes and preparing to see patient, ordering tests and/or medications, and counseling the patient.    CARLIN RONAL CENTERS, MD, FACOG Attending Obstetrician & Gynecologist, [facility] for [facility], [facility], [place] 01/22/2024

## 2024-01-22 NOTE — Addendum Note (Signed)
 Addended by: GRETTA, Tarvaris Puglia  L on: 01/22/2024 12:13 PM   Modules accepted: Orders

## 2024-01-23 LAB — CERVICOVAGINAL ANCILLARY ONLY
Chlamydia: NEGATIVE
Comment: NEGATIVE
Comment: NEGATIVE
Comment: NORMAL
Neisseria Gonorrhea: NEGATIVE
Trichomonas: NEGATIVE

## 2024-01-23 LAB — RPR: RPR Ser Ql: NONREACTIVE

## 2024-01-23 LAB — CBC
Hematocrit: 34.8 % (ref 34.0–46.6)
Hemoglobin: 11 g/dL — ABNORMAL LOW (ref 11.1–15.9)
MCH: 27 pg (ref 26.6–33.0)
MCHC: 31.6 g/dL (ref 31.5–35.7)
MCV: 86 fL (ref 79–97)
Platelets: 279 10*3/uL (ref 150–450)
RBC: 4.07 x10E6/uL (ref 3.77–5.28)
RDW: 13.6 % (ref 11.7–15.4)
WBC: 8.7 10*3/uL (ref 3.4–10.8)

## 2024-01-23 LAB — HIV ANTIBODY (ROUTINE TESTING W REFLEX): HIV Screen 4th Generation wRfx: NONREACTIVE

## 2024-01-24 ENCOUNTER — Encounter: Payer: Self-pay | Admitting: Obstetrics

## 2024-01-24 LAB — HEMOGLOBIN A1C
Est. average glucose Bld gHb Est-mCnc: 108 mg/dL
Hgb A1c MFr Bld: 5.4 % (ref 4.8–5.6)

## 2024-01-24 LAB — SPECIMEN STATUS REPORT

## 2024-01-28 ENCOUNTER — Other Ambulatory Visit: Payer: Self-pay

## 2024-01-28 MED ORDER — FLUCONAZOLE 150 MG PO TABS: 150.0000 mg | ORAL_TABLET | Freq: Once | ORAL | 0 refills | Status: AC

## 2024-02-05 ENCOUNTER — Ambulatory Visit (INDEPENDENT_AMBULATORY_CARE_PROVIDER_SITE_OTHER): Admitting: Advanced Practice Midwife

## 2024-02-05 ENCOUNTER — Other Ambulatory Visit: Payer: Self-pay

## 2024-02-05 ENCOUNTER — Encounter: Payer: Self-pay | Admitting: Advanced Practice Midwife

## 2024-02-05 ENCOUNTER — Telehealth: Payer: Self-pay

## 2024-02-05 VITALS — BP 126/84 | HR 87 | Wt 102.0 lb

## 2024-02-05 DIAGNOSIS — Z148 Genetic carrier of other disease: Secondary | ICD-10-CM

## 2024-02-05 DIAGNOSIS — Z3403 Encounter for supervision of normal first pregnancy, third trimester: Secondary | ICD-10-CM | POA: Diagnosis not present

## 2024-02-05 DIAGNOSIS — Z3A34 34 weeks gestation of pregnancy: Secondary | ICD-10-CM

## 2024-02-05 NOTE — Progress Notes (Deleted)
hiv

## 2024-02-05 NOTE — Progress Notes (Signed)
 Pt asking about ultrasound, would like to discuss with provider.   Pt states she is doing well.

## 2024-02-05 NOTE — Progress Notes (Signed)
   PRENATAL VISIT NOTE  Subjective:  Hannah Mckay is a 23 y.o. G1P0 at [redacted]w[redacted]d being seen today for ongoing prenatal care.  She is currently monitored for the following issues for this low-risk pregnancy and has Supervision of other normal pregnancy, antepartum on their problem list.  Patient reports no complaints.  Contractions: Irregular. Vag. Bleeding: None.  Movement: Increased. Denies leaking of fluid.   The following portions of the patient's history were reviewed and updated as appropriate: allergies, current medications, past family history, past medical history, past social history, past surgical history and problem list.   Objective:    Vitals:   02/05/24 0905  BP: 126/84  Pulse: 87  Weight: 102 lb (46.3 kg)    Fetal Status:  Fetal Heart Rate (bpm): 135   Movement: Increased    General: Alert, oriented and cooperative. Patient is in no acute distress.  Skin: Skin is warm and dry. No rash noted.   Cardiovascular: Normal heart rate noted  Respiratory: Normal respiratory effort, no problems with respiration noted  Abdomen: Soft, gravid, appropriate for gestational age.  Pain/Pressure: Present     Pelvic: Cervical exam deferred        Extremities: Normal range of motion.  Edema: None  Mental Status: Normal mood and affect. Normal behavior. Normal judgment and thought content.   Assessment and Plan:  Pregnancy: G1P0 at [redacted]w[redacted]d 1. Encounter for supervision of normal first pregnancy in third trimester --Anticipatory guidance about next visits/weeks of pregnancy given.   2. [redacted] weeks gestation of pregnancy  - HIV antibody (with reflex) - RPR - CBC - Glucose Tolerance, 2 Hours w/1 Hour  3. Carrier of spinal muscular atrophy (Primary) --No anatomy US , message sent to schedule Detail OB US   --Discussed partner testing today   Preterm labor symptoms and general obstetric precautions including but not limited to vaginal bleeding, contractions, leaking of fluid and fetal  movement were reviewed in detail with the patient. Please refer to After Visit Summary for other counseling recommendations.   Return in about 2 weeks (around 02/19/2024) for LOB.  Future Appointments  Date Time Provider Department Center  02/19/2024  2:30 PM Leftwich-Kirby, Olam LABOR, CNM [facility] None    Olam Boards, CNM

## 2024-02-06 LAB — CBC
Hematocrit: 35.2 % (ref 34.0–46.6)
Hemoglobin: 10.7 g/dL — ABNORMAL LOW (ref 11.1–15.9)
MCH: 26.1 pg — ABNORMAL LOW (ref 26.6–33.0)
MCHC: 30.4 g/dL — ABNORMAL LOW (ref 31.5–35.7)
MCV: 86 fL (ref 79–97)
Platelets: 295 x10E3/uL (ref 150–450)
RBC: 4.1 x10E6/uL (ref 3.77–5.28)
RDW: 14.3 % (ref 11.7–15.4)
WBC: 8.7 x10E3/uL (ref 3.4–10.8)

## 2024-02-06 LAB — GLUCOSE TOLERANCE, 2 HOURS W/ 1HR
Glucose, 1 hour: 152 mg/dL (ref 70–179)
Glucose, 2 hour: 115 mg/dL (ref 70–152)
Glucose, Fasting: 91 mg/dL (ref 70–91)

## 2024-02-06 LAB — RPR: RPR Ser Ql: NONREACTIVE

## 2024-02-06 LAB — HIV ANTIBODY (ROUTINE TESTING W REFLEX): HIV Screen 4th Generation wRfx: NONREACTIVE

## 2024-02-08 ENCOUNTER — Ambulatory Visit: Payer: Self-pay | Admitting: Advanced Practice Midwife

## 2024-02-08 ENCOUNTER — Encounter: Payer: Self-pay | Admitting: Advanced Practice Midwife

## 2024-02-08 DIAGNOSIS — Z3480 Encounter for supervision of other normal pregnancy, unspecified trimester: Secondary | ICD-10-CM | POA: Insufficient documentation

## 2024-02-10 ENCOUNTER — Encounter (HOSPITAL_COMMUNITY): Payer: Self-pay | Admitting: Obstetrics and Gynecology

## 2024-02-10 ENCOUNTER — Inpatient Hospital Stay (HOSPITAL_COMMUNITY)
Admission: AD | Admit: 2024-02-10 | Discharge: 2024-02-10 | Disposition: A | Discharge status: Home / Self Care | Attending: Obstetrics and Gynecology | Admitting: Obstetrics and Gynecology

## 2024-02-10 DIAGNOSIS — O99891 Other specified diseases and conditions complicating pregnancy: Secondary | ICD-10-CM | POA: Diagnosis present

## 2024-02-10 DIAGNOSIS — O99013 Anemia complicating pregnancy, third trimester: Secondary | ICD-10-CM | POA: Insufficient documentation

## 2024-02-10 DIAGNOSIS — R102 Pelvic and perineal pain: Secondary | ICD-10-CM | POA: Insufficient documentation

## 2024-02-10 DIAGNOSIS — R531 Weakness: Secondary | ICD-10-CM | POA: Diagnosis not present

## 2024-02-10 DIAGNOSIS — R9431 Abnormal electrocardiogram [ECG] [EKG]: Secondary | ICD-10-CM | POA: Diagnosis not present

## 2024-02-10 DIAGNOSIS — Z3A34 34 weeks gestation of pregnancy: Secondary | ICD-10-CM | POA: Insufficient documentation

## 2024-02-10 DIAGNOSIS — O26893 Other specified pregnancy related conditions, third trimester: Secondary | ICD-10-CM | POA: Diagnosis not present

## 2024-02-10 DIAGNOSIS — R42 Dizziness and giddiness: Secondary | ICD-10-CM | POA: Insufficient documentation

## 2024-02-10 LAB — URINALYSIS, ROUTINE W REFLEX MICROSCOPIC
Bilirubin Urine: NEGATIVE
Glucose, UA: NEGATIVE mg/dL
Hgb urine dipstick: NEGATIVE
Ketones, ur: NEGATIVE mg/dL
Nitrite: NEGATIVE
Protein, ur: NEGATIVE mg/dL
Specific Gravity, Urine: 1.006 (ref 1.005–1.030)
pH: 6 (ref 5.0–8.0)

## 2024-02-10 LAB — COMPREHENSIVE METABOLIC PANEL WITH GFR
ALT: 10 U/L (ref 0–44)
AST: 20 U/L (ref 15–41)
Albumin: 2.4 g/dL — ABNORMAL LOW (ref 3.5–5.0)
Alkaline Phosphatase: 135 U/L — ABNORMAL HIGH (ref 38–126)
Anion gap: 8 (ref 5–15)
BUN: 5 mg/dL — ABNORMAL LOW (ref 6–20)
CO2: 19 mmol/L — ABNORMAL LOW (ref 22–32)
Calcium: 9 mg/dL (ref 8.9–10.3)
Chloride: 108 mmol/L (ref 98–111)
Creatinine, Ser: 0.49 mg/dL (ref 0.44–1.00)
GFR, Estimated: >60 mL/min (ref >60)
Glucose, Bld: 76 mg/dL (ref 70–99)
Potassium: 3.7 mmol/L (ref 3.5–5.1)
Sodium: 135 mmol/L (ref 135–145)
Total Bilirubin: 0.4 mg/dL (ref 0.0–1.2)
Total Protein: 6.3 g/dL — ABNORMAL LOW (ref 6.5–8.1)

## 2024-02-10 LAB — CBC
HCT: 29.6 % — ABNORMAL LOW (ref 36.0–46.0)
Hemoglobin: 9.4 g/dL — ABNORMAL LOW (ref 12.0–15.0)
MCH: 25.6 pg — ABNORMAL LOW (ref 26.0–34.0)
MCHC: 31.8 g/dL (ref 30.0–36.0)
MCV: 80.7 fL (ref 80.0–100.0)
Platelets: 286 K/uL (ref 150–400)
RBC: 3.67 MIL/uL — ABNORMAL LOW (ref 3.87–5.11)
RDW: 14.7 % (ref 11.5–15.5)
WBC: 8.8 K/uL (ref 4.0–10.5)
nRBC: 0.3 % — ABNORMAL HIGH (ref 0.0–0.2)

## 2024-02-10 MED ORDER — LACTATED RINGERS IV BOLUS
1000.0000 mL | Freq: Once | INTRAVENOUS | Status: AC
Start: 2024-02-10 — End: 2024-02-10
  Administered 2024-02-10: 1000 mL via INTRAVENOUS

## 2024-02-10 NOTE — MAU Note (Signed)
 Hannah Mckay is a 23 y.o. at 100w5d here in MAU reporting: sitting and eating around 1400 and started to get dizzy and light headed. States she laid down on her left side and had water  and continued to feel dizzy. States it is still going and has not let up. Light headed gets worse when she is moving around. Feels weak and sluggish. Denies any N/V/D. Irregular CTXs. Denies any LOF or VB. +FM   Onset of complaint: 1400 Pain score: pelvic pain 7 Vitals:   02/10/24 1504  BP: 124/79  Pulse: 82  Resp: 18  Temp: 98.6 F (37 C)  SpO2: 96%     FHT:130  Lab orders placed from triage:  UA

## 2024-02-10 NOTE — Discharge Instructions (Signed)
 YOU NEED TO TAKE YOUR IRON SUPPLEMENTS EVERYDAY AND DRINK AT LEAST 8-10 BOTTLES OF WATER  EVERYDAY

## 2024-02-10 NOTE — MAU Provider Note (Signed)
 History     CSN: [id number]  Arrival date and time: 02/10/24 1442   Event Date/Time   First Provider Initiated Contact with Patient 02/10/24 1539      Chief Complaint  Patient presents with   Dizziness   HPI Ms. Hannah Mckay is a 23 y.o. year old G1P0 female at [redacted]w[redacted]d weeks gestation who presents to MAU reporting she got dizzy and light-headed while eating at 1400. She laid down on her LT side and had some water  to drink, but continued to feel dizzy. She states the dizziness is still going on and hasn't let up. She reported the light-headedness worsens with moving around. She reports feeling weak and sluggish. She reports pelvic pain and irregular UC's; rated 7/10. She denies VB, LOF or N/V/D. She report (+) FM. She receives [facility] with [facility]; next appt is 02/19/2024.    OB History     Gravida  1   Para      Term      Preterm      AB      Living         SAB      IAB      Ectopic      Multiple      Live Births              Past Medical History:  Diagnosis Date   Iron deficiency    Vitamin D deficiency     Past Surgical History:  Procedure Laterality Date   EYE SURGERY      Family History  Problem Relation Age of Onset   Diabetes Mother    Asthma Mother    Healthy Father     Social History   Tobacco Use   Smoking status: Never   Smokeless tobacco: Never  Vaping Use   Vaping status: Former  Substance Use Topics   Alcohol use: Not Currently    Comment: rare   Drug use: Never    Allergies:  Allergies  Allergen Reactions   Bee Venom Anaphylaxis   Cherry Itching and Rash    Medications Prior to Admission  Medication Sig Dispense Refill Last Dose/Taking   Prenatal Vit-Fe Fumarate-FA (PRENATAL MULTIVITAMIN) TABS tablet Take 1 tablet by mouth daily at 12 noon.   02/09/2024   acetaminophen  (TYLENOL ) 500 MG tablet Take 500 mg by mouth every 6 (six) hours as needed for moderate pain.   Unknown   cholecalciferol (VITAMIN D3) 25 MCG (1000  UNIT) tablet Take 1,000 Units by mouth daily. (Patient not taking: Reported on 02/05/2024)      Ferrous Sulfate (IRON PO) Take 1 tablet by mouth daily. (Patient not taking: Reported on 02/05/2024)      ondansetron  (ZOFRAN  ODT) 4 MG disintegrating tablet Take 1 tablet (4 mg total) by mouth every 6 (six) hours as needed for nausea or vomiting. (Patient not taking: Reported on 02/05/2024) 20 tablet 0    vitamin B-12 (CYANOCOBALAMIN) 100 MCG tablet Take 100 mcg by mouth daily. (Patient not taking: Reported on 02/05/2024)       Review of Systems  Constitutional: Negative.   HENT: Negative.    Eyes: Negative.   Respiratory: Negative.    Cardiovascular: Negative.   Gastrointestinal: Negative.   Endocrine: Negative.   Genitourinary:  Positive for pelvic pain (on-going issues thorughout pregnancy).  Musculoskeletal: Negative.   Skin: Negative.   Allergic/Immunologic: Negative.   Neurological:  Positive for dizziness, weakness and light-headedness.  Psychiatric/Behavioral: Negative.     Physical  Exam   Blood pressure 124/79, pulse 82, temperature 98.6 F (37 C), temperature source Oral, resp. rate 18, height 5' 2 (1.575 m), weight 46.1 kg, SpO2 96%.  Physical Exam Vitals and nursing note reviewed.  Constitutional:      Appearance: Normal appearance. She is normal weight.  HENT:     Head: Normocephalic and atraumatic.  Cardiovascular:     Rate and Rhythm: Normal rate.  Pulmonary:     Effort: Pulmonary effort is normal.  Abdominal:     Palpations: Abdomen is soft.  Genitourinary:    Comments: Not indicated Musculoskeletal:        General: Normal range of motion.  Skin:    General: Skin is warm and dry.  Neurological:     Mental Status: She is alert and oriented to person, place, and time.  Psychiatric:        Mood and Affect: Mood normal.        Behavior: Behavior normal.        Thought Content: Thought content normal.        Judgment: Judgment normal.    REACTIVE NST - FHR: 135 bpm  / moderate variability / accels present / decels absent / TOCO: UI noted Reassessment @ 1715: Patient requesting to be d/c'd home so she can go eat  MAU Course  Procedures  MDM CCUA CBC CMP EKG  Results for orders placed or performed during the hospital encounter of 02/10/24 (from the past 24 hours)  Urinalysis, Routine w reflex microscopic -Urine, Clean Catch     Status: Abnormal   Collection Time: 02/10/24  3:24 PM  Result Value Ref Range   Color, Urine YELLOW YELLOW   APPearance HAZY (A) CLEAR   Specific Gravity, Urine 1.006 1.005 - 1.030   pH 6.0 5.0 - 8.0   Glucose, UA NEGATIVE NEGATIVE mg/dL   Hgb urine dipstick NEGATIVE NEGATIVE   Bilirubin Urine NEGATIVE NEGATIVE   Ketones, ur NEGATIVE NEGATIVE mg/dL   Protein, ur NEGATIVE NEGATIVE mg/dL   Nitrite NEGATIVE NEGATIVE   Leukocytes,Ua LARGE (A) NEGATIVE   RBC / HPF 0-5 0 - 5 RBC/hpf   WBC, UA 11-20 0 - 5 WBC/hpf   Bacteria, UA RARE (A) NONE SEEN   Squamous Epithelial / HPF 6-10 0 - 5 /HPF  CBC     Status: Abnormal   Collection Time: 02/10/24  4:11 PM  Result Value Ref Range   WBC 8.8 4.0 - 10.5 K/uL   RBC 3.67 (L) 3.87 - 5.11 MIL/uL   Hemoglobin 9.4 (L) 12.0 - 15.0 g/dL   HCT 70.3 (L) 63.9 - 53.9 %   MCV 80.7 80.0 - 100.0 fL   MCH 25.6 (L) 26.0 - 34.0 pg   MCHC 31.8 30.0 - 36.0 g/dL   RDW 85.2 88.4 - 84.4 %   Platelets 286 150 - 400 K/uL   nRBC 0.3 (H) 0.0 - 0.2 %    *Consult with Dr. Nicholaus @ 1645 - notified of patient's complaints, assessments, lab & EKG results, recommend having someone from cardiology read EKG and advise on tx plan  TC to Dr. Babara Cort Flatten @ [phone] On-Call; after review of EKG and past EKG, no cardiac findings that would cause her sx's from today. The T-wave abnormality was present on the EKG 10 yrs ago. Cardiac not likely cause of problem today.   Assessment and Plan  1. Weakness (Primary) - Information provided on weakness - Advised to stay well-hydrated with at least 8-10 bottles  of water   everyday    2. Lightheadedness - Information provided on light-headedness - Advised to stay well-hydrated with at least 8-10 bottles of water  everyday   3. Anemia affecting pregnancy in third trimester - Information provided on anemia in pregnancy - Advised she needs to consistently take her iron supplements everyday as prescribed   4. [redacted] weeks gestation of pregnancy   - Discharge home - Keep scheduled appt with [facility] on 02/19/2024 - Patient verbalized an understanding of the plan of care and agrees.   Ala Cart, CNM 02/10/2024, 3:39 PM

## 2024-02-11 ENCOUNTER — Other Ambulatory Visit: Payer: Self-pay | Admitting: Advanced Practice Midwife

## 2024-02-11 DIAGNOSIS — B3731 Acute candidiasis of vulva and vagina: Secondary | ICD-10-CM

## 2024-02-11 MED ORDER — FLUCONAZOLE 150 MG PO TABS: ORAL_TABLET | ORAL | 0 refills | Status: AC

## 2024-02-19 ENCOUNTER — Other Ambulatory Visit (HOSPITAL_COMMUNITY)
Admission: RE | Admit: 2024-02-19 | Discharge: 2024-02-19 | Disposition: A | Discharge status: Home / Self Care | Source: Ambulatory Visit | Attending: Advanced Practice Midwife | Admitting: Advanced Practice Midwife

## 2024-02-19 ENCOUNTER — Ambulatory Visit (INDEPENDENT_AMBULATORY_CARE_PROVIDER_SITE_OTHER): Admitting: Advanced Practice Midwife

## 2024-02-19 ENCOUNTER — Encounter: Payer: Self-pay | Admitting: Advanced Practice Midwife

## 2024-02-19 VITALS — BP 121/75 | HR 87 | Wt 106.4 lb

## 2024-02-19 DIAGNOSIS — O26843 Uterine size-date discrepancy, third trimester: Secondary | ICD-10-CM | POA: Diagnosis not present

## 2024-02-19 DIAGNOSIS — Z3480 Encounter for supervision of other normal pregnancy, unspecified trimester: Secondary | ICD-10-CM | POA: Diagnosis present

## 2024-02-19 DIAGNOSIS — Z3A36 36 weeks gestation of pregnancy: Secondary | ICD-10-CM

## 2024-02-19 NOTE — Progress Notes (Signed)
   PRENATAL VISIT NOTE  Subjective:  Hannah Mckay is a 23 y.o. G1P0 at [redacted]w[redacted]d being seen today for ongoing prenatal care.  She is currently monitored for the following issues for this low-risk pregnancy and has Supervision of other normal pregnancy, antepartum on their problem list.  Patient reports no complaints.  Contractions: Irritability. Vag. Bleeding: None.  Movement: Increased. Denies leaking of fluid.   The following portions of the patient's history were reviewed and updated as appropriate: allergies, current medications, past family history, past medical history, past social history, past surgical history and problem list.   Objective:    Vitals:   02/19/24 1429  BP: 121/75  Pulse: 87  Weight: 106 lb 6.4 oz (48.3 kg)    Fetal Status:  Fetal Heart Rate (bpm): 145   Movement: Increased    General: Alert, oriented and cooperative. Patient is in no acute distress.  Skin: Skin is warm and dry. No rash noted.   Cardiovascular: Normal heart rate noted  Respiratory: Normal respiratory effort, no problems with respiration noted  Abdomen: Soft, gravid, appropriate for gestational age.  Pain/Pressure: Present     Pelvic: Cervical exam deferred        Extremities: Normal range of motion.  Edema: None  Mental Status: Normal mood and affect. Normal behavior. Normal judgment and thought content.   Assessment and Plan:  Pregnancy: G1P0 at [redacted]w[redacted]d 1. Supervision of other normal pregnancy, antepartum (Primary) --Anticipatory guidance about next visits/weeks of pregnancy given.   - Culture, beta strep (group b only) - US  MFM OB COMP + 14 WK; Future - Cervicovaginal ancillary only( [place])  2. Uterine size-date discrepancy in third trimester --Pt measures 2 weeks behind, but pt is small constitutionally, so may be wnl - US  MFM OB COMP + 14 WK; Future  3. [redacted] weeks gestation of pregnancy   Preterm labor symptoms and general obstetric precautions including but not limited to  vaginal bleeding, contractions, leaking of fluid and fetal movement were reviewed in detail with the patient. Please refer to After Visit Summary for other counseling recommendations.   No follow-ups on file.  Future Appointments  Date Time Provider Department Center  02/28/2024  4:10 PM Davis, Devon E, PA-C [facility] None    Olam Boards, [place]

## 2024-02-19 NOTE — Progress Notes (Signed)
 36 week swabs. Asking about cervix check.  No other concerns.

## 2024-02-19 NOTE — Patient Instructions (Signed)

## 2024-02-20 LAB — CERVICOVAGINAL ANCILLARY ONLY
Chlamydia: NEGATIVE
Comment: NEGATIVE
Comment: NEGATIVE
Comment: NORMAL
Neisseria Gonorrhea: NEGATIVE
Trichomonas: NEGATIVE

## 2024-02-23 LAB — CULTURE, BETA STREP (GROUP B ONLY): Strep Gp B Culture: NEGATIVE

## 2024-02-28 ENCOUNTER — Ambulatory Visit: Admitting: Physician Assistant

## 2024-02-28 VITALS — BP 123/82 | HR 89 | Wt 109.7 lb

## 2024-02-28 DIAGNOSIS — Z3A37 37 weeks gestation of pregnancy: Secondary | ICD-10-CM | POA: Diagnosis not present

## 2024-02-28 DIAGNOSIS — Z3480 Encounter for supervision of other normal pregnancy, unspecified trimester: Secondary | ICD-10-CM

## 2024-02-28 NOTE — Progress Notes (Signed)
 Pt presents for ROB. No questions or concerns.

## 2024-02-28 NOTE — Progress Notes (Signed)
   PRENATAL VISIT NOTE  Subjective:  Hannah Mckay is a 23 y.o. G1P0 at [redacted]w[redacted]d being seen today for ongoing prenatal care.  She is currently monitored for the following issues for this low-risk pregnancy and has Supervision of other normal pregnancy, antepartum on their problem list.  Patient reports no complaints.  Contractions: Irritability. Vag. Bleeding: None.  Movement: Present. Denies leaking of fluid.   The following portions of the patient's history were reviewed and updated as appropriate: allergies, current medications, past family history, past medical history, past social history, past surgical history and problem list.   Objective:    Vitals:   02/28/24 1634  BP: 123/82  Pulse: 89  Weight: 109 lb 11.2 oz (49.8 kg)    Fetal Status:  Fetal Heart Rate (bpm): 136 Fundal Height: 37 cm Movement: Present    General: Alert, oriented and cooperative. Patient is in no acute distress.  Skin: Skin is warm and dry. No rash noted.   Cardiovascular: Normal heart rate noted  Respiratory: Normal respiratory effort, no problems with respiration noted  Abdomen: Soft, gravid, appropriate for gestational age.  Pain/Pressure: Present     Pelvic: Cervical exam deferred        Extremities: Normal range of motion.  Edema: None  Mental Status: Normal mood and affect. Normal behavior. Normal judgment and thought content.   Assessment and Plan:  Pregnancy: G1P0 at [redacted]w[redacted]d  1. Supervision of other normal pregnancy, antepartum (Primary) Patient doing well, feeling regular fetal movement  BP, FHR, FH appropriate   2. [redacted] weeks gestation of pregnancy Anticipatory guidance about next visits/weeks of pregnancy given.   Term labor symptoms and general obstetric precautions including but not limited to vaginal bleeding, contractions, leaking of fluid and fetal movement were reviewed in detail with the patient.  Please refer to After Visit Summary for other counseling recommendations.   No  follow-ups on file.  Future Appointments  Date Time Provider Department Center  03/11/2024  2:30 PM Treysen Sudbeck E, PA-C [facility] None    Maurio Baize E Clarnce Homan, PA-C

## 2024-03-11 ENCOUNTER — Encounter: Payer: Self-pay | Admitting: Physician Assistant

## 2024-03-11 ENCOUNTER — Ambulatory Visit: Admitting: Physician Assistant

## 2024-03-11 VITALS — BP 113/73 | HR 81 | Wt 107.7 lb

## 2024-03-11 DIAGNOSIS — Z3480 Encounter for supervision of other normal pregnancy, unspecified trimester: Secondary | ICD-10-CM | POA: Diagnosis not present

## 2024-03-11 DIAGNOSIS — Z3A39 39 weeks gestation of pregnancy: Secondary | ICD-10-CM

## 2024-03-11 NOTE — Progress Notes (Signed)
   PRENATAL VISIT NOTE  Subjective:  Hannah Mckay is a 23 y.o. G1P0 at [redacted]w[redacted]d being seen today for ongoing prenatal care.  She is currently monitored for the following issues for this low-risk pregnancy and has Supervision of other normal pregnancy, antepartum on their problem list.  Patient reports no complaints.  Contractions: Not present. Vag. Bleeding: None.  Movement: Present. Denies leaking of fluid.   The following portions of the patient's history were reviewed and updated as appropriate: allergies, current medications, past family history, past medical history, past social history, past surgical history and problem list.   Objective:    Vitals:   03/11/24 1438  BP: 113/73  Pulse: 81  Weight: 107 lb 11.2 oz (48.9 kg)    Fetal Status:  Fetal Heart Rate (bpm): 145 Fundal Height: 38 cm Movement: Present    General: Alert, oriented and cooperative. Patient is in no acute distress.  Skin: Skin is warm and dry. No rash noted.   Cardiovascular: Normal heart rate noted  Respiratory: Normal respiratory effort, no problems with respiration noted  Abdomen: Soft, gravid, appropriate for gestational age.  Pain/Pressure: Present     Pelvic: 80%, FT        Extremities: Normal range of motion.  Edema: None  Mental Status: Normal mood and affect. Normal behavior. Normal judgment and thought content.   Assessment and Plan:  Pregnancy: G1P0 at [redacted]w[redacted]d  1. Supervision of other normal pregnancy, antepartum (Primary) Patient doing well, feeling regular fetal movement  BP, FHR, FH appropriate  2. [redacted] weeks gestation of pregnancy -Anticipatory guidance about next visits/weeks of pregnancy given.  -Patient would like IOL at soonest date possible. Called and submitted form.   Term labor symptoms and general obstetric precautions including but not limited to vaginal bleeding, contractions, leaking of fluid and fetal movement were reviewed in detail with the patient.  Please refer to After  Visit Summary for other counseling recommendations.   Return in about 1 week (around 03/18/2024) for LOB.  No future appointments.   Azalie Harbeck E Salem Lembke, PA-C

## 2024-03-11 NOTE — Patient Instructions (Signed)
 Safe Over-The-Counter Medications in Pregnancy   Acne:  Benzoyl Peroxide  Salicylic Acid   Backache/Headache:  Tylenol : 2 regular strength every 4 hours OR               2 Extra strength every 6 hours   Colds/Coughs/Allergies:  Benadryl  (alcohol free) 25 mg every 6 hours as needed  Breath right strips  Claritin  Cepacol throat lozenges  Chloraseptic throat spray  Cold-Eeze- up to three times per day  Cough drops, alcohol free  Flonase (by prescription only)  Guaifenesin  Mucinex  Robitussin DM (plain only, alcohol free)  Saline nasal spray/drops  Sudafed (pseudoephedrine) & Actifed * use only after [redacted] weeks gestation and if you do not have high blood pressure  Tylenol   Vicks Vaporub  Zinc lozenges  Zyrtec   Constipation:  Colace  Ducolax suppositories  Fleet enema  Glycerin  suppositories  Metamucil  Milk of magnesia  Miralax  Senokot  Smooth move tea   Diarrhea:  Kaopectate  Imodium A-D   *NO pepto Bismol*  Hemorrhoids:  Anusol  Anusol HC  Preparation H  Tucks   Indigestion:  Tums  Maalox  Mylanta  Zantac  Pepcid   Insomnia:  Benadryl  (alcohol free) 25mg  every 6 hours as needed  Tylenol  PM  Unisom, no Gelcaps   Leg Cramps:  Tums  MagGel   Nausea/Vomiting:  Bonine  Dramamine  Emetrol  Ginger extract  Sea bands  Meclizine  Nausea medication to take during pregnancy:  Unisom (doxylamine succinate 25 mg tablets) Take one tablet daily at bedtime. If symptoms are not adequately controlled, the dose can be increased to a maximum recommended dose of two tablets daily (1/2 tablet in the morning, 1/2 tablet mid-afternoon and one at bedtime).  Vitamin B6 100mg  tablets. Take one tablet twice a day (up to 200 mg per day).   Skin Rashes:  Aveeno products  Benadryl  cream or 25mg  every 6 hours as needed  Calamine Lotion  1% cortisone cream   Yeast infection:  Gyne-lotrimin 7  Monistat 7   **If taking multiple medications, please check labels to  avoid duplicating the same active ingredients  **take medication as directed on the label  ** Do not exceed 4000 mg of tylenol  in 24 hours  **Do not take medications that contain aspirin or ibuprofen      While you are pregnant, there are some foods you should not eat or eat only in small amounts:  Certain types of cooked fish--While you're pregnant, do not eat bigeye tuna, king mackerel, marlin, orange roughy, shark, swordfish, or tilefish. Limit white (albacore) tuna to only 6 ounces a week. These fish may have high levels of mercury, which can be harmful during pregnancy. All other types of cooked fish are safe and healthy for you and your pregnancy. Try to eat at least two servings of fish or shellfish per week.  Caffeine --Caffeine  is found in coffee, tea, chocolate, energy drinks, and soft drinks. It's a good idea to limit your daily intake of caffeine  to less than 200 milligrams, which is the amount in one 12-ounce cup of coffee. Limiting caffeine  can help with nausea and sleep problems.  Sushi--Raw fish may be harmful during pregnancy. Cooked sushi is fine.  Unpasteurized milk and cheese--These foods can cause a disease called listeriosis. Avoid cheeses that are made with raw milk (such as some feta, queso fresco, and blue cheeses). Hot dogs and lunch meats can also cause this disease, although it's rare. To be on  the safe side, only eat hot dogs and lunch meats that have been heated until steaming hot.

## 2024-03-11 NOTE — Progress Notes (Signed)
 Pt presents for rob. Pt complains of heartburn. Pt lost weight do to not being able to keep anything down.

## 2024-03-18 ENCOUNTER — Encounter (HOSPITAL_COMMUNITY): Payer: Self-pay | Admitting: Family Medicine

## 2024-03-18 ENCOUNTER — Inpatient Hospital Stay (HOSPITAL_COMMUNITY): Admitting: Anesthesiology

## 2024-03-18 ENCOUNTER — Other Ambulatory Visit: Payer: Self-pay

## 2024-03-18 ENCOUNTER — Inpatient Hospital Stay (HOSPITAL_COMMUNITY)
Admission: AD | Admit: 2024-03-18 | Discharge: 2024-03-21 | DRG: 805 | Disposition: A | Discharge status: Home / Self Care | Attending: Obstetrics & Gynecology | Admitting: Obstetrics & Gynecology

## 2024-03-18 ENCOUNTER — Encounter (HOSPITAL_COMMUNITY): Payer: Self-pay

## 2024-03-18 DIAGNOSIS — O134 Gestational [pregnancy-induced] hypertension without significant proteinuria, complicating childbirth: Principal | ICD-10-CM | POA: Diagnosis present

## 2024-03-18 DIAGNOSIS — Z5941 Food insecurity: Secondary | ICD-10-CM

## 2024-03-18 DIAGNOSIS — Z3A40 40 weeks gestation of pregnancy: Secondary | ICD-10-CM

## 2024-03-18 DIAGNOSIS — Z3480 Encounter for supervision of other normal pregnancy, unspecified trimester: Principal | ICD-10-CM

## 2024-03-18 DIAGNOSIS — O139 Gestational [pregnancy-induced] hypertension without significant proteinuria, unspecified trimester: Secondary | ICD-10-CM | POA: Diagnosis not present

## 2024-03-18 DIAGNOSIS — D500 Iron deficiency anemia secondary to blood loss (chronic): Secondary | ICD-10-CM | POA: Diagnosis not present

## 2024-03-18 DIAGNOSIS — O480 Post-term pregnancy: Secondary | ICD-10-CM | POA: Diagnosis not present

## 2024-03-18 DIAGNOSIS — Z8759 Personal history of other complications of pregnancy, childbirth and the puerperium: Secondary | ICD-10-CM | POA: Diagnosis not present

## 2024-03-18 DIAGNOSIS — O9081 Anemia of the puerperium: Secondary | ICD-10-CM | POA: Insufficient documentation

## 2024-03-18 DIAGNOSIS — Z833 Family history of diabetes mellitus: Secondary | ICD-10-CM

## 2024-03-18 DIAGNOSIS — O26893 Other specified pregnancy related conditions, third trimester: Secondary | ICD-10-CM | POA: Diagnosis present

## 2024-03-18 DIAGNOSIS — O411230 Chorioamnionitis, third trimester, not applicable or unspecified: Secondary | ICD-10-CM | POA: Diagnosis present

## 2024-03-18 LAB — PROTEIN / CREATININE RATIO, URINE
Creatinine, Urine: 28 mg/dL
Protein Creatinine Ratio: 1.68 mg/mg{creat} — ABNORMAL HIGH (ref 0.00–0.15)
Total Protein, Urine: 47 mg/dL

## 2024-03-18 LAB — CBC
HCT: 29.8 % — ABNORMAL LOW (ref 36.0–46.0)
Hemoglobin: 8.9 g/dL — ABNORMAL LOW (ref 12.0–15.0)
MCH: 23.4 pg — ABNORMAL LOW (ref 26.0–34.0)
MCHC: 29.9 g/dL — ABNORMAL LOW (ref 30.0–36.0)
MCV: 78.4 fL — ABNORMAL LOW (ref 80.0–100.0)
Platelets: 246 K/uL (ref 150–400)
RBC: 3.8 MIL/uL — ABNORMAL LOW (ref 3.87–5.11)
RDW: 17.2 % — ABNORMAL HIGH (ref 11.5–15.5)
WBC: 6.9 K/uL (ref 4.0–10.5)
nRBC: 1 % — ABNORMAL HIGH (ref 0.0–0.2)

## 2024-03-18 LAB — COMPREHENSIVE METABOLIC PANEL WITH GFR
ALT: 8 U/L (ref 0–44)
AST: 21 U/L (ref 15–41)
Albumin: 2.6 g/dL — ABNORMAL LOW (ref 3.5–5.0)
Alkaline Phosphatase: 181 U/L — ABNORMAL HIGH (ref 38–126)
Anion gap: 8 (ref 5–15)
BUN: 8 mg/dL (ref 6–20)
CO2: 19 mmol/L — ABNORMAL LOW (ref 22–32)
Calcium: 8.5 mg/dL — ABNORMAL LOW (ref 8.9–10.3)
Chloride: 109 mmol/L (ref 98–111)
Creatinine, Ser: 0.68 mg/dL (ref 0.44–1.00)
GFR, Estimated: >60 mL/min (ref >60)
Glucose, Bld: 73 mg/dL (ref 70–99)
Potassium: 3.8 mmol/L (ref 3.5–5.1)
Sodium: 136 mmol/L (ref 135–145)
Total Bilirubin: 0.6 mg/dL (ref 0.0–1.2)
Total Protein: 6.5 g/dL (ref 6.5–8.1)

## 2024-03-18 LAB — TYPE AND SCREEN
ABO/RH(D): O POS
Antibody Screen: NEGATIVE

## 2024-03-18 LAB — RPR: RPR Ser Ql: NONREACTIVE

## 2024-03-18 MED ORDER — SOD CITRATE-CITRIC ACID 500-334 MG/5ML PO SOLN: 30.0000 mL | ORAL | Status: DC | PRN

## 2024-03-18 MED ORDER — PHENYLEPHRINE 80 MCG/ML (10ML) SYRINGE FOR IV PUSH (FOR BLOOD PRESSURE SUPPORT): 80.0000 ug | PREFILLED_SYRINGE | INTRAVENOUS | Status: DC | PRN

## 2024-03-18 MED ORDER — OXYCODONE-ACETAMINOPHEN 5-325 MG PO TABS: 2.0000 | ORAL_TABLET | ORAL | Status: DC | PRN

## 2024-03-18 MED ORDER — OXYCODONE-ACETAMINOPHEN 5-325 MG PO TABS: 1.0000 | ORAL_TABLET | ORAL | Status: DC | PRN

## 2024-03-18 MED ORDER — FENTANYL-BUPIVACAINE-NACL 0.5-0.125-0.9 MG/250ML-% EP SOLN
12.0000 mL/h | EPIDURAL | Status: DC | PRN
  Administered 2024-03-18: 12 mL/h via EPIDURAL
  Filled 2024-03-18: qty 250

## 2024-03-18 MED ORDER — DIPHENHYDRAMINE HCL 50 MG/ML IJ SOLN: 12.5000 mg | INTRAMUSCULAR | Status: DC | PRN

## 2024-03-18 MED ORDER — LACTATED RINGERS IV SOLN: 500.0000 mL | INTRAVENOUS | Status: DC | PRN

## 2024-03-18 MED ORDER — ONDANSETRON HCL 4 MG/2ML IJ SOLN
4.0000 mg | Freq: Four times a day (QID) | INTRAMUSCULAR | Status: DC | PRN
  Administered 2024-03-18 – 2024-03-19 (×3): 4 mg via INTRAVENOUS
  Filled 2024-03-18 (×3): qty 2

## 2024-03-18 MED ORDER — OXYTOCIN-SODIUM CHLORIDE 30-0.9 UT/500ML-% IV SOLN
1.0000 m[IU]/min | INTRAVENOUS | Status: DC
  Administered 2024-03-18: 2 m[IU]/min via INTRAVENOUS

## 2024-03-18 MED ORDER — EPHEDRINE 5 MG/ML INJ: 10.0000 mg | INTRAVENOUS | Status: DC | PRN

## 2024-03-18 MED ORDER — LACTATED RINGERS IV SOLN: INTRAVENOUS | Status: DC

## 2024-03-18 MED ORDER — OXYTOCIN BOLUS FROM INFUSION
333.0000 mL | Freq: Once | INTRAVENOUS | Status: AC
  Administered 2024-03-19: 333 mL via INTRAVENOUS

## 2024-03-18 MED ORDER — LIDOCAINE HCL (PF) 1 % IJ SOLN
INTRAMUSCULAR | Status: DC | PRN
  Administered 2024-03-18: 11 mL via EPIDURAL

## 2024-03-18 MED ORDER — OXYTOCIN-SODIUM CHLORIDE 30-0.9 UT/500ML-% IV SOLN
2.5000 [IU]/h | INTRAVENOUS | Status: DC
  Administered 2024-03-19: 2.5 [IU]/h via INTRAVENOUS
  Filled 2024-03-18 (×2): qty 500

## 2024-03-18 MED ORDER — LIDOCAINE HCL (PF) 1 % IJ SOLN: 30.0000 mL | INTRAMUSCULAR | Status: DC | PRN

## 2024-03-18 MED ORDER — LACTATED RINGERS IV SOLN
500.0000 mL | Freq: Once | INTRAVENOUS | Status: DC
Start: 2024-03-18 — End: 2024-03-19

## 2024-03-18 MED ORDER — ACETAMINOPHEN 325 MG PO TABS: 650.0000 mg | ORAL_TABLET | ORAL | Status: DC | PRN

## 2024-03-18 MED ORDER — FENTANYL CITRATE (PF) 100 MCG/2ML IJ SOLN
50.0000 ug | INTRAMUSCULAR | Status: DC | PRN
  Administered 2024-03-18: 50 ug via INTRAVENOUS
  Filled 2024-03-18: qty 2

## 2024-03-18 MED ORDER — TERBUTALINE SULFATE 1 MG/ML IJ SOLN: 0.2500 mg | Freq: Once | INTRAMUSCULAR | Status: DC | PRN

## 2024-03-18 NOTE — H&P (Signed)
 OBSTETRIC ADMISSION HISTORY AND PHYSICAL  Hannah Mckay is a 23 y.o. female G1P0 with IUP at [redacted]w[redacted]d by LMP c/w 1T US  presenting for elective IOL. She reports +FMs, No LOF, no VB, no blurry vision, headaches or peripheral edema, and RUQ pain.  She plans on breast feeding. She is considering depo for birth control. She received her prenatal care at [facility] with transfer to [facility]   Notably she reports 7-8wk US  confirming her dates, as well as a normal anatomy scan that was incomplete, and she was not able to complete it prior to transfer to [facility].  Both parents SMA carriers.  Dating: By LMP --->  Estimated Date of Delivery: 03/18/24  Sono:  Not available, normal but incomplete per patient  Prenatal History/Complications: none  Past Medical History: Past Medical History:  Diagnosis Date   Iron deficiency    Vitamin D deficiency     Past Surgical History: Past Surgical History:  Procedure Laterality Date   EYE SURGERY      Obstetrical History: OB History     Gravida  1   Para      Term      Preterm      AB      Living         SAB      IAB      Ectopic      Multiple      Live Births              Social History Social History   Socioeconomic History   Marital status: Single    Spouse name: Not on file   Number of children: Not on file   Years of education: Not on file   Highest education level: Not on file  Occupational History   Not on file  Tobacco Use   Smoking status: Never   Smokeless tobacco: Never  Vaping Use   Vaping status: Former  Substance and Sexual Activity   Alcohol use: Not Currently    Comment: rare   Drug use: Never   Sexual activity: Yes    Partners: Male    Comment: no protection  Other Topics Concern   Not on file  Social History Narrative   Not on file   Social Drivers of Health   Financial Resource Strain: Not on file  Food Insecurity: Food Insecurity Present (03/18/2024)    Hunger Vital Sign    Worried About Running Out of Food in the Last Year: Often true    Ran Out of Food in the Last Year: Often true  Transportation Needs: Unknown (03/18/2024)   PRAPARE - Administrator, Civil Service (Medical): No    Lack of Transportation (Non-Medical): Not on file  Physical Activity: Not on file  Stress: Not on file  Social Connections: Not on file    Family History: Family History  Problem Relation Age of Onset   Diabetes Mother    Asthma Mother    Healthy Father     Allergies: Allergies  Allergen Reactions   Bee Venom Anaphylaxis   Cherry Itching and Rash    Medications Prior to Admission  Medication Sig Dispense Refill Last Dose/Taking   Prenatal Vit-Fe Fumarate-FA (PRENATAL MULTIVITAMIN) TABS tablet Take 1 tablet by mouth daily at 12 noon.   Past Week   acetaminophen  (TYLENOL ) 500 MG tablet Take 500 mg by mouth every 6 (six) hours as needed for moderate pain.  cholecalciferol (VITAMIN D3) 25 MCG (1000 UNIT) tablet Take 1,000 Units by mouth daily. (Patient not taking: Reported on 03/11/2024)      Ferrous Sulfate  (IRON PO) Take 1 tablet by mouth daily. (Patient not taking: Reported on 03/11/2024)      fluconazole  (DIFLUCAN ) 150 MG tablet Take one tablet now and one in 3 days. (Patient not taking: Reported on 03/11/2024) 2 tablet 0    ondansetron  (ZOFRAN  ODT) 4 MG disintegrating tablet Take 1 tablet (4 mg total) by mouth every 6 (six) hours as needed for nausea or vomiting. (Patient not taking: Reported on 03/11/2024) 20 tablet 0    vitamin B-12 (CYANOCOBALAMIN) 100 MCG tablet Take 100 mcg by mouth daily. (Patient not taking: Reported on 03/11/2024)        Review of Systems   All systems reviewed and negative except as stated in HPI  Blood pressure (!) 146/101, pulse 87, resp. rate 16, height 5' 2 (1.575 m), weight 50.2 kg. General appearance: alert, cooperative, and appears stated age Lungs: clear to auscultation bilaterally Heart: regular  rate and rhythm Abdomen: soft, non-tender; bowel sounds normal Pelvic: deferred Extremities: No edema, no sign of DVT Presentation: cephalic Fetal monitoringBaseline: 120 bpm, Variability: Good {> 6 bpm), Accelerations: none, Decelerations: Absent, and Cat I Uterine activityFrequency: Every 6 minutes     Prenatal labs: ABO, Rh: --/--/O POS (08/19 1033) Antibody: NEG (08/19 1033) Rubella:   RPR: Non Reactive (07/08 0854)  HBsAg:    HIV: Non Reactive (07/08 0854)  GBS: Negative/-- (07/22 1559)    Lab Results  Component Value Date   GBS Negative 02/19/2024   GTT passed Genetic screening:  Both parents SMA carriers Anatomy US : normal but incomplete per patient, no records available at this time.   There is no immunization history on file for this patient.  Prenatal Transfer Tool  Maternal Diabetes: No Genetic Screening: Declined Maternal Ultrasounds/Referrals: Other: Fetal Ultrasounds or other Referrals:  None Maternal Substance Abuse:  No Significant Maternal Medications:  None Significant Maternal Lab Results: Other:  Hb of 8.9 on admission Number of Prenatal Visits:greater than 3 verified prenatal visits Other Comments:  None   Results for orders placed or performed during the hospital encounter of 03/18/24 (from the past 24 hours)  CBC   Collection Time: 03/18/24 10:02 AM  Result Value Ref Range   WBC 6.9 4.0 - 10.5 K/uL   RBC 3.80 (L) 3.87 - 5.11 MIL/uL   Hemoglobin 8.9 (L) 12.0 - 15.0 g/dL   HCT 70.1 (L) 63.9 - 53.9 %   MCV 78.4 (L) 80.0 - 100.0 fL   MCH 23.4 (L) 26.0 - 34.0 pg   MCHC 29.9 (L) 30.0 - 36.0 g/dL   RDW 82.7 (H) 88.4 - 84.4 %   Platelets 246 150 - 400 K/uL   nRBC 1.0 (H) 0.0 - 0.2 %  Comprehensive metabolic panel   Collection Time: 03/18/24 10:02 AM  Result Value Ref Range   Sodium 136 135 - 145 mmol/L   Potassium 3.8 3.5 - 5.1 mmol/L   Chloride 109 98 - 111 mmol/L   CO2 19 (L) 22 - 32 mmol/L   Glucose, Bld 73 70 - 99 mg/dL   BUN 8 6 - 20  mg/dL   Creatinine, Ser 9.31 0.44 - 1.00 mg/dL   Calcium 8.5 (L) 8.9 - 10.3 mg/dL   Total Protein 6.5 6.5 - 8.1 g/dL   Albumin 2.6 (L) 3.5 - 5.0 g/dL   AST 21 15 - 41 U/L  ALT 8 0 - 44 U/L   Alkaline Phosphatase 181 (H) 38 - 126 U/L   Total Bilirubin 0.6 0.0 - 1.2 mg/dL   GFR, Estimated >39 >39 mL/min   Anion gap 8 5 - 15  Type and screen [facility]   Collection Time: 03/18/24 10:33 AM  Result Value Ref Range   ABO/RH(D) O POS    Antibody Screen NEG    Sample Expiration      03/21/2024,2359 Performed at [facility], [address] [place], [place] [address]     Patient Active Problem List   Diagnosis Date Noted   Normal labor 03/18/2024   Supervision of other normal pregnancy, antepartum 02/08/2024    Assessment/Plan:  Hannah Mckay is a 23 y.o. G1P0 at [redacted]w[redacted]d here for elective IOL  #Labor: not in labor, IOL with cervical balloon #Pain: Epidural when desired #FWB: Category I #GBS status:  negative #Feeding: Breastmilk  #Reproductive Life planning: considering depo #Circ:  not applicable  Leeroy KATHEE Pouch, MD  03/18/2024, 12:52 PM

## 2024-03-18 NOTE — Plan of Care (Signed)

## 2024-03-18 NOTE — Anesthesia Preprocedure Evaluation (Signed)
 Anesthesia Evaluation  Patient identified by MRN, date of birth, ID band Patient awake    Reviewed: Allergy & Precautions, H&P , NPO status , Patient's Chart, lab work & pertinent test results  Airway Mallampati: II  TM Distance: >3 FB Neck ROM: Full    Dental no notable dental hx.    Pulmonary neg pulmonary ROS   Pulmonary exam normal breath sounds clear to auscultation       Cardiovascular negative cardio ROS Normal cardiovascular exam Rhythm:Regular Rate:Normal     Neuro/Psych negative neurological ROS  negative psych ROS   GI/Hepatic negative GI ROS, Neg liver ROS,,,  Endo/Other  negative endocrine ROS    Renal/GU negative Renal ROS  negative genitourinary   Musculoskeletal negative musculoskeletal ROS (+)    Abdominal   Peds negative pediatric ROS (+)  Hematology negative hematology ROS (+)   Anesthesia Other Findings   Reproductive/Obstetrics (+) Pregnancy                             Anesthesia Physical Anesthesia Plan  ASA: 2  Anesthesia Plan: Epidural   Post-op Pain Management:    Induction:   PONV Risk Score and Plan:   Airway Management Planned:   Additional Equipment:   Intra-op Plan:   Post-operative Plan:   Informed Consent:   Plan Discussed with:   Anesthesia Plan Comments:        Anesthesia Quick Evaluation

## 2024-03-18 NOTE — Progress Notes (Signed)
 Labor progress note:  Pt presented for elective IOL. Pregnancy c/b late transfer of care, but has otherwise been uncomplicated. Cat I tracing. Discussed placement of FB w pt, pt premedicated w Fentanyl  and FB placed successfully. Pt tolerated well. Will await expulsion of FB then discuss AROM/Pitocin .  Alain Sor, MD OB Fellow, Faculty Practice [facility], Center for [facility]

## 2024-03-18 NOTE — Progress Notes (Signed)
 Labor progress note:  S/p FB. Was contracting more painfully, but now much more comfortable w epidural. EFM: 125/mod/+a/-d. Contractions every 3-4 min. Discussed role of AROM w pt, pt provided verbal consent. AROM performed w small amount of clear fluid. Will start Pitocin  2x2.   Alain Sor, MD OB Fellow, Faculty Practice [facility], Center for [facility]

## 2024-03-18 NOTE — Progress Notes (Signed)
 LABOR PROGRESS NOTE  Patient Name: Hannah Mckay, female   DOB: November 14, 2000, 23 y.o.  MRN: [id number]  Introduced myself as night team. Comfortable with epidural, shakes. Intermittent pressure.   Blood pressure (!) 142/85, pulse 85, temperature 97.7 F (36.5 C), temperature source Axillary, resp. rate 17, height 5' 2 (1.575 m), weight 50.2 kg, SpO2 100%.  Dilation: 7 Effacement (%): 100 Station: -1 Presentation: Vertex Exam by:: lee  EFM: baseline 125, accels, no decels, moderate variability TOCO: q1.5-41min contractions  Continue to titrate pitocin  as indicated.  Anticipate SVD.   Mardy Shropshire, MD

## 2024-03-18 NOTE — Anesthesia Procedure Notes (Signed)
 Epidural Patient location during procedure: OB Start time: 03/18/2024 2:54 PM End time: 03/18/2024 3:08 PM  Staffing Anesthesiologist: Cleotilde Butler Dade, MD Performed: anesthesiologist   Preanesthetic Checklist Completed: patient identified, IV checked, site marked, risks and benefits discussed, surgical consent, monitors and equipment checked, pre-op evaluation and timeout performed  Epidural Patient position: sitting Prep: ChloraPrep Patient monitoring: heart rate, cardiac monitor, continuous pulse ox and blood pressure Approach: midline Location: L2-L3 Injection technique: LOR saline  Needle:  Needle type: Tuohy  Needle gauge: 17 G Needle length: 9 cm Needle insertion depth: 5 cm Catheter type: closed end flexible Catheter size: 20 Guage Catheter at skin depth: 9 cm Test dose: negative  Assessment Events: blood not aspirated, injection not painful, no injection resistance, no paresthesia and negative IV test  Additional Notes Reason for block:procedure for pain

## 2024-03-19 ENCOUNTER — Encounter (HOSPITAL_COMMUNITY): Payer: Self-pay | Admitting: Family Medicine

## 2024-03-19 DIAGNOSIS — Z3A40 40 weeks gestation of pregnancy: Secondary | ICD-10-CM

## 2024-03-19 DIAGNOSIS — O411230 Chorioamnionitis, third trimester, not applicable or unspecified: Secondary | ICD-10-CM

## 2024-03-19 DIAGNOSIS — O134 Gestational [pregnancy-induced] hypertension without significant proteinuria, complicating childbirth: Secondary | ICD-10-CM

## 2024-03-19 DIAGNOSIS — O480 Post-term pregnancy: Secondary | ICD-10-CM

## 2024-03-19 MED ORDER — OXYCODONE HCL 5 MG PO TABS: 5.0000 mg | ORAL_TABLET | ORAL | Status: DC | PRN

## 2024-03-19 MED ORDER — ONDANSETRON HCL 4 MG/2ML IJ SOLN
4.0000 mg | INTRAMUSCULAR | Status: DC | PRN
  Administered 2024-03-19: 4 mg via INTRAVENOUS

## 2024-03-19 MED ORDER — SIMETHICONE 80 MG PO CHEW: 80.0000 mg | CHEWABLE_TABLET | ORAL | Status: DC | PRN

## 2024-03-19 MED ORDER — ONDANSETRON HCL 4 MG PO TABS: 4.0000 mg | ORAL_TABLET | ORAL | Status: DC | PRN

## 2024-03-19 MED ORDER — WITCH HAZEL-GLYCERIN EX PADS
1.0000 | MEDICATED_PAD | CUTANEOUS | Status: DC | PRN
  Administered 2024-03-19: 1 via TOPICAL

## 2024-03-19 MED ORDER — ACETAMINOPHEN 325 MG PO TABS: 650.0000 mg | ORAL_TABLET | ORAL | Status: DC | PRN

## 2024-03-19 MED ORDER — PRENATAL MULTIVITAMIN CH
1.0000 | ORAL_TABLET | Freq: Every day | ORAL | Status: DC
  Administered 2024-03-19 – 2024-03-21 (×3): 1 via ORAL
  Filled 2024-03-19 (×3): qty 1

## 2024-03-19 MED ORDER — SODIUM CHLORIDE 0.9% FLUSH
3.0000 mL | Freq: Two times a day (BID) | INTRAVENOUS | Status: DC
  Administered 2024-03-20 (×2): 3 mL via INTRAVENOUS

## 2024-03-19 MED ORDER — SODIUM CHLORIDE 0.9% FLUSH: 3.0000 mL | INTRAVENOUS | Status: DC | PRN

## 2024-03-19 MED ORDER — ONDANSETRON HCL 4 MG/2ML IJ SOLN
INTRAMUSCULAR | Status: AC
  Filled 2024-03-19: qty 2

## 2024-03-19 MED ORDER — ZOLPIDEM TARTRATE 5 MG PO TABS: 5.0000 mg | ORAL_TABLET | Freq: Every evening | ORAL | Status: DC | PRN

## 2024-03-19 MED ORDER — BENZOCAINE-MENTHOL 20-0.5 % EX AERO
1.0000 | INHALATION_SPRAY | CUTANEOUS | Status: DC | PRN
  Administered 2024-03-19 – 2024-03-21 (×3): 1 via TOPICAL
  Filled 2024-03-19 (×3): qty 56

## 2024-03-19 MED ORDER — IBUPROFEN 600 MG PO TABS
600.0000 mg | ORAL_TABLET | Freq: Four times a day (QID) | ORAL | Status: DC
  Administered 2024-03-19 – 2024-03-21 (×8): 600 mg via ORAL
  Filled 2024-03-19 (×9): qty 1

## 2024-03-19 MED ORDER — SODIUM CHLORIDE 0.9 % IV SOLN
2.0000 g | Freq: Four times a day (QID) | INTRAVENOUS | Status: DC
  Administered 2024-03-19: 2 g via INTRAVENOUS
  Filled 2024-03-19: qty 2000

## 2024-03-19 MED ORDER — GENTAMICIN SULFATE 40 MG/ML IJ SOLN
5.0000 mg/kg | INTRAVENOUS | Status: DC
  Administered 2024-03-19: 250 mg via INTRAVENOUS
  Filled 2024-03-19: qty 6.25

## 2024-03-19 MED ORDER — SODIUM CHLORIDE 0.9 % IV SOLN: 250.0000 mL | INTRAVENOUS | Status: DC | PRN

## 2024-03-19 MED ORDER — DIBUCAINE (PERIANAL) 1 % EX OINT
1.0000 | TOPICAL_OINTMENT | CUTANEOUS | Status: DC | PRN
  Administered 2024-03-19: 1 via RECTAL
  Filled 2024-03-19: qty 28

## 2024-03-19 MED ORDER — SENNOSIDES-DOCUSATE SODIUM 8.6-50 MG PO TABS
2.0000 | ORAL_TABLET | ORAL | Status: DC
  Administered 2024-03-19 – 2024-03-21 (×3): 2 via ORAL
  Filled 2024-03-19 (×3): qty 2

## 2024-03-19 MED ORDER — MEASLES, MUMPS & RUBELLA VAC IJ SOLR: 0.5000 mL | Freq: Once | INTRAMUSCULAR | Status: DC

## 2024-03-19 MED ORDER — COCONUT OIL OIL: 1.0000 | TOPICAL_OIL | Status: DC | PRN

## 2024-03-19 MED ORDER — TETANUS-DIPHTH-ACELL PERTUSSIS 5-2.5-18.5 LF-MCG/0.5 IM SUSY: 0.5000 mL | PREFILLED_SYRINGE | Freq: Once | INTRAMUSCULAR | Status: DC

## 2024-03-19 MED ORDER — ACETAMINOPHEN 500 MG PO TABS
1000.0000 mg | ORAL_TABLET | ORAL | Status: DC | PRN
  Administered 2024-03-19: 1000 mg via ORAL
  Filled 2024-03-19: qty 2

## 2024-03-19 MED ORDER — DIPHENHYDRAMINE HCL 25 MG PO CAPS: 25.0000 mg | ORAL_CAPSULE | Freq: Four times a day (QID) | ORAL | Status: DC | PRN

## 2024-03-19 NOTE — Progress Notes (Signed)
 ANTIBIOTIC CONSULT NOTE - INITIAL  Pharmacy Consult for Gentamicin  Indication: Chorioamnionitis   Allergies  Allergen Reactions   Bee Venom Anaphylaxis   Cherry Itching and Rash    Patient Measurements: Height: 5' 2 (157.5 cm) Weight: 50.2 kg (110 lb 9.6 oz) IBW/kg (Calculated) : 50.1 Adjusted Body Weight: 50.1 kg  Vital Signs: Temp: 100.5 F (38.1 C) (08/20 0030) Temp Source: Oral (08/20 0030) BP: 123/72 (08/20 0000) Pulse Rate: 96 (08/20 0000)  Labs: Recent Labs    03/18/24 1002 03/18/24 1843  WBC 6.9  --   HGB 8.9*  --   PLT 246  --   LABCREA  --  28  CREATININE 0.68  --    No results for input(s): GENTTROUGH, GENTPEAK, GENTRANDOM in the last 72 hours.   Microbiology: Recent Results (from the past 720 hours)  Culture, beta strep (group b only)     Status: None   Collection Time: 02/19/24  3:59 PM   Specimen: Vaginal/Rectal; Genital   VR  Result Value Ref Range Status   Strep Gp B Culture Negative Negative Final    Comment: Centers for Disease Control and Prevention (CDC) and American Congress of Obstetricians and Gynecologists (ACOG) guidelines for prevention of perinatal group B streptococcal (GBS) disease specify co-collection of a vaginal and rectal swab specimen to maximize sensitivity of GBS detection. Per the CDC and ACOG, swabbing both the lower vagina and rectum substantially increases the yield of detection compared with sampling the vagina alone. Penicillin G, ampicillin , or cefazolin are indicated for intrapartum prophylaxis of perinatal GBS colonization. Reflex susceptibility testing should be performed prior to use of clindamycin only on GBS isolates from penicillin-allergic women who are considered a high risk for anaphylaxis. Treatment with vancomycin without additional testing is warranted if resistance to clindamycin is noted.     Medications:  Ampicillin  2g IV q6h (8/20>>  Assessment: 23 y.o. female G2P0010 at [redacted]w[redacted]d here for  induction of labor. Now with fever of 100.6F.   Goal of Therapy:  Gentamicin  peak 6-8 mg/L and Trough < 1 mg/L  Plan:  Gentamicin  5 mg/kg (250mg ) mg IV every 24 hrs  Check Scr with next labs if gentamicin  continued. Will check gentamicin  levels if continued > 72hr or clinically indicated.  Richerd Pellegrino Kennard 03/19/2024,12:46 AM

## 2024-03-19 NOTE — Lactation Note (Signed)
 This note was copied from a baby's chart.  NICU Lactation Consultation Note  Patient Name: Hannah Mckay Date: 03/19/2024 Age:24 hours  Reason for consult: Primapara; 1st time breastfeeding; Initial assessment; NICU baby; Term  SUBJECTIVE  LC in to visit with P1 Mom of baby Hannah Mckay delivered vaginally at term.  Baby admitted to NICU for neonatal depression and respiratory failure.  Baby is undergoing therapeutic hypothermia and is NPO currently on room air.  Mom plans to breastfeed and breast milk feed.  LC encouraged initiating pumping and breast massage early after birth and Mom in agreement.  LC set up DEBP and assisted with first pumping following demonstrating breast massage and hand expression.  LC set up a washing and a drying basin, labeled and provided CDC guidelines reviewed them with parents. Mom encouraged to ask for help prn.  Plan recommended and written on dry erase board- 1- STS with baby when able to in the NICU 2- massage breasts and hand express 3- Pump both breasts 15 mins every 2-3 hrs when awake  OBJECTIVE Infant data: No data recorded O2 Device: Room Air FiO2 (%): 21 %  Infant feeding assessment No data recorded  Maternal data: G2P1011 Vaginal, Vacuum (Extractor) Has patient been taught Hand Expression?: Yes Hand Expression Comments: easy flow of colostrum Significant Breast History:: ++ breast changes Current breast feeding challenges:: infant admitted to NICU Does the patient have breastfeeding experience prior to this delivery?: No Pumping frequency: Initiated double pumping at 4 hrs post delivery, encouraged to pump every 2-3 hrs when awake Pumped volume: 0 mL (drops of colostrum noted) Flange Size: 18 Hands-free pumping top sizes: Small/Medium (Blue) Risk factor for low/delayed milk supply:: Infant separation  Pump: Personal, DEBP (Spectra  from insurance)  ASSESSMENT Infant:  Feeding Status: NPO  Maternal: Milk volume:  Normal  INTERVENTIONS/PLAN Interventions: Interventions: Breast feeding basics reviewed; Breast massage; Hand express; DEBP; Education; [employer] brochure; CDC Guidelines for Breast Pump Cleaning Tools: Pump; Flanges; Hands-free pumping top Pump Education: Setup, frequency, and cleaning; Milk Storage  Plan: Consult Status: NICU follow-up NICU Follow-up type: New admission follow up   Claudene Aleck BRAVO 03/19/2024, 10:18 AM

## 2024-03-19 NOTE — Discharge Summary (Signed)
 Postpartum Discharge Summary  Date of Service updated***     Patient Name: Hannah Mckay DOB: 2001/05/01 MRN: [id number]  Date of admission: 03/18/2024 Delivery date:03/19/2024 Delivering provider: ELDONNA SUZEN OCTAVE Date of discharge: 03/20/2024  Admitting diagnosis: Normal labor [O80, Z37.9] Intrauterine pregnancy: [redacted]w[redacted]d     Secondary diagnosis:  Principal Problem:   Normal labor Active Problems:   Vacuum-assisted vaginal delivery   Obstetrical laceration, second degree   Gestational hypertension   Postpartum anemia  Additional problems: none    Discharge diagnosis: Term Pregnancy Delivered                                              Post partum procedures:none Augmentation: AROM, Pitocin , and IP Foley Complications: Intrauterine Inflammation or infection (Chorioamniotis)  Hospital course: Induction of Labor With Vaginal Delivery   23 y.o. yo G2P1011 at [redacted]w[redacted]d was admitted to the hospital 03/18/2024 for induction of labor.  Indication for induction: Elective.  Patient had an labor course complicated by development of intrauterine infection treated with ampicillin  and gentamicin . Prolonged fetal decels just prior to delivery required vacuum assisted delivery, three tight nuchal cords noted at time of delivery.  Membrane Rupture Time/Date: 3:24 PM,03/18/2024  Delivery Method:Vaginal, Vacuum (Extractor) Operative Delivery:Device used:Kiwi Indication: Fetal indications Episiotomy: None Lacerations:  2nd degree Details of delivery can be found in separate delivery note.  Patient had a postpartum course complicated by continued hypertension  Started on lasix , potassium and procardia . Patient is discharged home 03/20/24.  Newborn Data: Birth date:03/19/2024 Birth time:5:52 AM Gender:Female Living status:Living Apgars:1 ,2  Weight:2960 g  Magnesium Sulfate received: No BMZ received: No Rhophylac:N/A MMR:Yes T-DaP:declined Flu: No RSV Vaccine received:  No Transfusion:No  Immunizations received: There is no immunization history for the selected administration types on file for this patient.  Physical exam  Vitals:   03/20/24 0858 03/20/24 1403 03/20/24 2001 03/20/24 2234  BP: 114/79 126/89 (!) 136/93 (!) 128/98  Pulse: 68 73 92 68  Resp: 16 17 16 18   Temp: 98.4 F (36.9 C)  98 F (36.7 C) 98.9 F (37.2 C)  TempSrc: Oral  Oral Oral  SpO2: 100% 100% 100% 100%  Weight:      Height:       General: alert, cooperative, and no distress Lochia: appropriate Uterine Fundus: firm Incision: N/A DVT Evaluation: No evidence of DVT seen on physical exam. Negative Homan's sign. No cords or calf tenderness. No significant calf/ankle edema. Labs: Lab Results  Component Value Date   WBC 23.1 (H) 03/20/2024   HGB 8.1 (L) 03/20/2024   HCT 26.3 (L) 03/20/2024   MCV 78.0 (L) 03/20/2024   PLT 213 03/20/2024      Latest Ref Rng & Units 03/18/2024   10:02 AM  CMP  Glucose 70 - 99 mg/dL 73   BUN 6 - 20 mg/dL 8   Creatinine 9.55 - 8.99 mg/dL 9.31   Sodium 864 - 854 mmol/L 136   Potassium 3.5 - 5.1 mmol/L 3.8   Chloride 98 - 111 mmol/L 109   CO2 22 - 32 mmol/L 19   Calcium 8.9 - 10.3 mg/dL 8.5   Total Protein 6.5 - 8.1 g/dL 6.5   Total Bilirubin 0.0 - 1.2 mg/dL 0.6   Alkaline Phos 38 - 126 U/L 181   AST 15 - 41 U/L 21   ALT 0 - 44 U/L  8    Edinburgh Score:    03/19/2024    8:56 AM  Edinburgh Postnatal Depression Scale Screening Tool  I have been able to laugh and see the funny side of things. --   No data recorded  After visit meds:  Allergies as of 03/20/2024       Reactions   Bee Venom Anaphylaxis   Cherry Itching, Rash     Med Rec must be completed prior to using this [facility]***        Discharge home in stable condition Infant Feeding: NICU intubated Infant Disposition:NICU Discharge instruction: per After Visit Summary and Postpartum booklet. Activity: Advance as tolerated. Pelvic rest for 6 weeks.  Diet:  routine diet Future Appointments: Future Appointments  Date Time Provider Department Center  04/16/2024  8:55 AM Rudy Carlin LABOR, MD [facility] None   Follow up Visit:  Follow-up Information     [facility] for [facility] at [facility]. Schedule an appointment as soon as possible for a visit in 4 week(s).   Specialty: Obstetrics and Gynecology Why: Routine postpartum follow up in 4-6 weeks Contact information: [address], [address] [place]  [address] [phone]               Message to [facility] 8/20  Please schedule this patient for a In person postpartum visit in 4 weeks with the following provider: Any provider. Additional Postpartum F/U:None  Low risk pregnancy complicated by: None Delivery mode:  Vaginal, Vacuum (Extractor) Anticipated Birth Control:  Depo   03/20/2024 Cathlean Ely, CNM

## 2024-03-19 NOTE — Progress Notes (Signed)
 LABOR PROGRESS NOTE  Patient Name: Hannah Mckay, female   DOB: 15-Jan-2001, 23 y.o.  MRN: [id number]  Maternal fever, increasing fetal baseline. Discussed with patient this points towards intrauterine infections and that I would recommend treatment with Ampicillin  and Gentamicin  at least until delivery, and potentially for 24 hours afterwards depending on how quickly her fever resolves. Pt agreeable to antibiotics.   Blood pressure 123/72, pulse 96, temperature (!) 100.5 F (38.1 C), temperature source Oral, resp. rate 16, height 5' 2 (1.575 m), weight 50.2 kg, SpO2 100%.  Dilation: 9 Effacement (%): 100 Station: 0 Presentation: Vertex Exam by:: Marquita C, RN  EFM: baseline 155, accels, no decels, moderate variability TOCO: q1-1.12min contractions  Start amp/gent 1g Tylenol  Pitocin  off, given Ctx every 1-1.5 minutes Anticipate SVD  Jabril Pursell, MD

## 2024-03-19 NOTE — Progress Notes (Signed)
 Kiwi vac applied

## 2024-03-19 NOTE — Anesthesia Postprocedure Evaluation (Signed)
 Anesthesia Post Note  Patient: Hannah Mckay  Procedure(s) Performed: AN AD HOC LABOR EPIDURAL     Patient location during evaluation: Mother Baby Anesthesia Type: Epidural Level of consciousness: awake and alert Pain management: pain level controlled Vital Signs Assessment: post-procedure vital signs reviewed and stable Respiratory status: spontaneous breathing, nonlabored ventilation and respiratory function stable Cardiovascular status: stable Postop Assessment: no headache, no backache and epidural receding Anesthetic complications: no   No notable events documented.  Last Vitals:  Vitals:   03/19/24 0745 03/19/24 0856  BP: (!) 144/92 139/87  Pulse: 76 80  Resp:  18  Temp:  37 C  SpO2:  100%    Last Pain:  Vitals:   03/19/24 1116  TempSrc:   PainSc: 4    Pain Goal:                   Nydia Ytuarte

## 2024-03-20 DIAGNOSIS — Z8759 Personal history of other complications of pregnancy, childbirth and the puerperium: Secondary | ICD-10-CM | POA: Diagnosis not present

## 2024-03-20 DIAGNOSIS — O9081 Anemia of the puerperium: Secondary | ICD-10-CM | POA: Insufficient documentation

## 2024-03-20 DIAGNOSIS — O139 Gestational [pregnancy-induced] hypertension without significant proteinuria, unspecified trimester: Secondary | ICD-10-CM | POA: Diagnosis not present

## 2024-03-20 LAB — CBC
HCT: 26.3 % — ABNORMAL LOW (ref 36.0–46.0)
Hemoglobin: 8.1 g/dL — ABNORMAL LOW (ref 12.0–15.0)
MCH: 24 pg — ABNORMAL LOW (ref 26.0–34.0)
MCHC: 30.8 g/dL (ref 30.0–36.0)
MCV: 78 fL — ABNORMAL LOW (ref 80.0–100.0)
Platelets: 213 K/uL (ref 150–400)
RBC: 3.37 MIL/uL — ABNORMAL LOW (ref 3.87–5.11)
RDW: 17.3 % — ABNORMAL HIGH (ref 11.5–15.5)
WBC: 23.1 K/uL — ABNORMAL HIGH (ref 4.0–10.5)
nRBC: 0.1 % (ref 0.0–0.2)

## 2024-03-20 MED ORDER — FUROSEMIDE 20 MG PO TABS
20.0000 mg | ORAL_TABLET | Freq: Every day | ORAL | Status: DC
  Administered 2024-03-21: 20 mg via ORAL
  Filled 2024-03-20: qty 1

## 2024-03-20 MED ORDER — MEDROXYPROGESTERONE ACETATE 150 MG/ML IM SUSP
150.0000 mg | Freq: Once | INTRAMUSCULAR | Status: AC
  Administered 2024-03-21: 150 mg via INTRAMUSCULAR
  Filled 2024-03-20: qty 1

## 2024-03-20 MED ORDER — FERROUS SULFATE 325 (65 FE) MG PO TABS
325.0000 mg | ORAL_TABLET | ORAL | Status: DC
  Administered 2024-03-20: 325 mg via ORAL
  Filled 2024-03-20: qty 1

## 2024-03-20 MED ORDER — POTASSIUM CHLORIDE CRYS ER 20 MEQ PO TBCR
20.0000 meq | EXTENDED_RELEASE_TABLET | Freq: Every day | ORAL | Status: DC
  Administered 2024-03-21: 20 meq via ORAL
  Filled 2024-03-20: qty 1

## 2024-03-20 MED ORDER — NIFEDIPINE ER OSMOTIC RELEASE 30 MG PO TB24: 30.0000 mg | ORAL_TABLET | Freq: Every day | ORAL | Status: DC

## 2024-03-20 NOTE — Lactation Note (Signed)
 This note was copied from a baby's chart.  NICU Lactation Consultation Note  Patient Name: Hannah Mckay Date: 03/20/2024 Age:24 hours  Reason for consult: Follow-up assessment; NICU baby; Primapara; 1st time breastfeeding; Term; Other (Comment) (neonatal hypothermia)  SUBJECTIVE  LC in to visit with P1 Mom of baby [name] in the NICU.  Baby is undergoing therapeutic hypothermia.  Mom has been pumping and has colostrum that she brought to the NICU.  Baby NPO, but first feeding can be mother's colostrum.  Mom states she has pumped 3 times so far, encouraged Mom to increase her pumping frequency to every 2-3 hrs today.  Mom in agreement.  Engorgement prevention reviewed.   OBJECTIVE Infant data: No data recorded O2 Device: Room Air FiO2 (%): 21 %  Infant feeding assessment No data recorded  Maternal data: G2P1011 Vaginal, Vacuum (Extractor) Has patient been taught Hand Expression?: Yes Hand Expression Comments: easy flow of colostrum Significant Breast History:: ++ breast changes Current breast feeding challenges:: infant admitted to NICU Does the patient have breastfeeding experience prior to this delivery?: No Pumping frequency: Mom pumped 3 times yesterday, encouraged to increase frequency and pump every 2-3 hrs today. Pumped volume: 5 mL Flange Size: 18 Hands-free pumping top sizes: Small/Medium (Blue) Risk factor for low/delayed milk supply:: Infant separation  Pump: Personal, DEBP (Spectra  from insurance)  ASSESSMENT Infant:  Feeding Status: NPO  Maternal: Milk volume: Normal  INTERVENTIONS/PLAN Interventions: Interventions: DEBP; Education Tools: Pump; Flanges Pump Education: Setup, frequency, and cleaning; Milk Storage  Plan: Consult Status: NICU follow-up NICU Follow-up type: Verify onset of copious milk; Verify absence of engorgement; Maternal D/C visit   Claudene Aleck BRAVO RN [facility] 03/20/2024, 1:57 PM

## 2024-03-20 NOTE — Progress Notes (Signed)
 Post Partum Day 1 Subjective: No complaints, up ad lib, voiding, tolerating PO, and + flatus. Mild bleeding reported.  Breastfeeding. Baby is stable in NICU.  Objective: Blood pressure 114/79, pulse 68, temperature 98.4 F (36.9 C), temperature source Oral, resp. rate 16, height 5' 2 (1.575 m), weight 50.2 kg, SpO2 100%, unknown if currently breastfeeding.  Physical Exam:  General: alert and no distress Lochia: appropriate Uterine Fundus: firm, nontender DVT Evaluation: No evidence of DVT seen on physical exam.  Recent Labs    03/18/24 1002 03/20/24 0517  HGB 8.9* 8.1*  HCT 29.8* 26.3*    Assessment/Plan: Breastfeeding, Lactation consulted On oral iron therapy for asymptomatic but clinically significant postpartum anemia due to expected blood loss. Desires Depo Provera  for contraception, this will be given prior to discharge Routine postpartum care.    LOS: 2 days   Gloris Hugger, MD 03/20/2024, 9:32 AM

## 2024-03-21 ENCOUNTER — Other Ambulatory Visit (HOSPITAL_COMMUNITY): Payer: Self-pay

## 2024-03-21 MED ORDER — FUROSEMIDE 20 MG PO TABS
20.0000 mg | ORAL_TABLET | Freq: Every day | ORAL | 0 refills | Status: AC
  Filled 2024-03-21: qty 5, 5d supply, fill #0

## 2024-03-21 MED ORDER — IBUPROFEN 600 MG PO TABS
600.0000 mg | ORAL_TABLET | Freq: Four times a day (QID) | ORAL | 0 refills | Status: AC
  Filled 2024-03-21: qty 30, 8d supply, fill #0

## 2024-03-21 MED ORDER — NIFEDIPINE ER OSMOTIC RELEASE 30 MG PO TB24
30.0000 mg | ORAL_TABLET | Freq: Every day | ORAL | Status: DC
  Administered 2024-03-21: 30 mg via ORAL
  Filled 2024-03-21: qty 1

## 2024-03-21 MED ORDER — POTASSIUM CHLORIDE CRYS ER 20 MEQ PO TBCR
20.0000 meq | EXTENDED_RELEASE_TABLET | Freq: Every day | ORAL | 0 refills | Status: AC
Start: 2024-03-21 — End: ?
  Filled 2024-03-21: qty 5, 5d supply, fill #0

## 2024-03-21 MED ORDER — ACETAMINOPHEN 325 MG PO TABS: 650.0000 mg | ORAL_TABLET | ORAL | Status: AC | PRN

## 2024-03-21 MED ORDER — FERROUS SULFATE 325 (65 FE) MG PO TABS
325.0000 mg | ORAL_TABLET | ORAL | 0 refills | Status: AC
  Filled 2024-03-21: qty 45, 90d supply, fill #0

## 2024-03-21 MED ORDER — NIFEDIPINE ER 30 MG PO TB24
30.0000 mg | ORAL_TABLET | Freq: Every day | ORAL | 1 refills | Status: AC
  Filled 2024-03-21: qty 30, 30d supply, fill #0

## 2024-03-21 NOTE — Lactation Note (Signed)
 This note was copied from a baby's chart.  NICU Lactation Consultation Note  Patient Name: Hannah Mckay Unijb'd Date: 03/21/2024 Age:24 hours  Reason for consult: Follow-up assessment; Primapara; 1st time breastfeeding; NICU baby; Term; Maternal discharge  SUBJECTIVE Visited with family of 55 9/75 weeks old AGA NICU female; Hannah Mckay is a P1 and reported she's been pumping and getting enough colostrum to collect, praised her for her efforts. Her plan is to do both, direct breastfeeding along with pumping and bottle feeding once baby Hannah Mckay starts feeding PO. Noticed that pumping hasn't been consistent yet. She's getting [facility] the [facility]. Reviewed discharge education, pump settings and the importance of consistent pumping for the onset of lactogenesis II and the prevention of engorgement. Discussed getting inserts for her Spectra  pump at home.  OBJECTIVE Infant data: Mother's Current Feeding Choice: -- (NPO)  O2 Device: Room Air  Maternal data: G2P1011 Vaginal, Vacuum (Extractor) Pumping frequency: 4 times/24 hours; encouraged pumping q 3 hours Pumped volume: 5 mL Flange Size: 18 Hands-free pumping top sizes: Small/Medium (Blue)  Pump: Personal, DEBP (Spectra  from insurance)  Feeding Status: NPO  Maternal: Milk volume: Normal  INTERVENTIONS/PLAN Interventions: Interventions: Breast feeding basics reviewed; DEBP; Education; Coconut oil Discharge Education: Engorgement and breast care Tools: Pump; Flanges Pump Education: Setup, frequency, and cleaning; Milk Storage  Plan: STS once able to Pump both breasts on initiate mode every 3 hours for 15 minutes, ideally 8 pumping sessions/24 hours Switch to maintain mode once expressing +20 ml of EBM combined Take all pump pieces to baby's room after her discharge  FOB present and supportive. All questions and concerns answered; family to contact [facility] services PRN.  Consult Status: NICU follow-up NICU Follow-up  type: Verify onset of copious milk; Verify absence of engorgement   Macai Sisneros S Teighan Aubert 03/21/2024, 11:45 AM

## 2024-03-21 NOTE — Progress Notes (Signed)
 Patient told she needs to be discharged before midnight or if they charge nurse needs the bed. She understood.

## 2024-03-21 NOTE — Progress Notes (Signed)
 Patient signed up for Babyscripts.

## 2024-03-21 NOTE — Progress Notes (Signed)
 Patient wants to go home later but everything done this am and patient is signed up for babyscripts instructions given to take her BP and put it in the app.

## 2024-03-24 ENCOUNTER — Ambulatory Visit: Payer: Self-pay | Admitting: Family Medicine

## 2024-03-24 LAB — SURGICAL PATHOLOGY

## 2024-03-25 ENCOUNTER — Inpatient Hospital Stay (HOSPITAL_COMMUNITY)

## 2024-03-26 NOTE — Lactation Note (Signed)
 This note was copied from a baby's chart.  NICU Lactation Consultation Note  Patient Name: Hannah Mckay Unijb'd Date: 03/26/2024 Age:24 days  Reason for consult: Weekly NICU follow-up; NICU baby; Primapara; 1st time breastfeeding; Mother's request; RN request; Term  SUBJECTIVE Visited with family of 75 81/61 weeks old NICU female Hannah Mckay; Ms. Hamric is a P1 and reported she's been pumping and also trying putting baby to breast, her goal is to work on breastfeeding before starting bottles. Noticed that she's not pumping at night and going 4 hours in between sessions sometimes. Explained the importance of consistent pumping for the prevention of engorgement and to protect her supply (see LATCH score). This LC took Hannah Mckay to the L side but she did not wake up to feed, even with stimulation. Assisted with breast massage and hand expression and gently rubbed a drop of EBM on her mouth but she did not arouse during this care time, an attempt was documented in flowsheets. Reviewed pumping schedule, pre-feeding activities and IDF 1/2.   OBJECTIVE Infant data: Mother's Current Feeding Choice: Breast Milk  O2 Device: Room Air  Infant feeding assessment IDFTS - Readiness: 3   Maternal data: G2P1011 Vaginal, Vacuum (Extractor) Pumping frequency: 5-6 times/24 hours; encouraged q 3 hours Pumped volume: 150 mL  Pump: Personal, DEBP (Spectra  from insurance)  ASSESSMENT Infant: Latch: Too sleepy or reluctant, no latch achieved, no sucking elicited. Audible Swallowing: None Type of Nipple: Everted at rest and after stimulation Comfort (Breast/Nipple): Soft / non-tender (breast were full but not engorged) Hold (Positioning): Assistance needed to correctly position infant at breast and maintain latch. LATCH Score: 5  Feeding Status: Scheduled 9-12-3-6 Feeding method: Tube/Gavage (Bolus)  Maternal: Milk volume: Normal  INTERVENTIONS/PLAN Interventions: Interventions: Breast feeding basics  reviewed; Assisted with latch; Breast massage; Hand express; Breast compression; Adjust position; Support pillows; Coconut oil; DEBP; Education  Plan: STS around care times Pump both breasts on maintain mode every 3 hours for 15-30 minutes, ideally 8 pumping sessions/24 hours Continue taking baby to a semi-pumped breast on feeding cues around feeding times and call out for assistance PRN. If she doesn't latch, couplet will engage in STS care Parents will start doing some pre-feeding activities in addition to breastfeeding such as paci dips and holding baby during gavage feedings   FOB present and supportive. All questions and concerns answered; family to contact [facility] services PRN.  Consult Status: NICU follow-up NICU Follow-up type: Weekly NICU follow up   Serene Kopf GORMAN Crate 03/26/2024, 12:57 PM

## 2024-03-28 ENCOUNTER — Telehealth (HOSPITAL_COMMUNITY): Payer: Self-pay | Admitting: *Deleted

## 2024-03-28 NOTE — Telephone Encounter (Signed)
 03/28/2024  Name: Hannah Mckay MRN: [id number] DOB: 12-Sep-2000  Reason for Call:  Transition of Care Hospital Discharge Call  Contact Status: Patient Contact Status: Complete  Language assistant needed: Interpreter Mode: Interpreter Not Needed        Follow-Up Questions: Do You Have Any Concerns About Your Health As You Heal From Delivery?: Yes What Concerns Do You Have About Your Health?: Patient describes having lower abdominal pain that makes walking more difficult.  She also notes needing to strain in order to pass urine, but she never feels like she completely empties her bladder. The straining puts pressure on her perineal stitches which is also uncomfortable.  Advised patient to be seen at MD office for further evaluation.  Informed patient that she could be seen in MAU if office is closed or unable to work her in this afternoon (a Friday afternoon before a holiday weekend). Do You Have Any Concerns About Your Infants Health?: Infant in NICU  Edinburgh Postnatal Depression Scale:  In the Past 7 Days:    PHQ2-9 Depression Scale:     Discharge Follow-up: Edinburgh score requires follow up?:  (says that answers are the same as in the hospital when score was 0, endorses that she is doing well emotionally) Patient was advised of the following resources:: Support Group, Breastfeeding Support Group  Post-discharge interventions: NA  Mliss Sieve, RN 03/28/2024 15:20

## 2024-03-28 NOTE — Telephone Encounter (Signed)
 03/28/2024  Name: Hannah Mckay MRN: [id number] DOB: 2001/03/16  Reason for Call:  Transition of Care Hospital Discharge Call  Contact Status: Patient Contact Status: Message  Language assistant needed:          Follow-Up Questions:    Van Postnatal Depression Scale:  In the Past 7 Days:    PHQ2-9 Depression Scale:     Discharge Follow-up:    Post-discharge interventions: NA  Mliss Sieve, RN 03/28/2024 14:04

## 2024-04-16 ENCOUNTER — Ambulatory Visit: Admitting: Obstetrics

## 2024-05-19 ENCOUNTER — Ambulatory Visit (INDEPENDENT_AMBULATORY_CARE_PROVIDER_SITE_OTHER): Admitting: Obstetrics and Gynecology

## 2024-05-19 ENCOUNTER — Other Ambulatory Visit (HOSPITAL_COMMUNITY)
Admission: RE | Admit: 2024-05-19 | Discharge: 2024-05-19 | Disposition: A | Discharge status: Home / Self Care | Source: Ambulatory Visit | Attending: Obstetrics | Admitting: Obstetrics

## 2024-05-19 ENCOUNTER — Encounter: Payer: Self-pay | Admitting: Obstetrics and Gynecology

## 2024-05-19 DIAGNOSIS — K649 Unspecified hemorrhoids: Secondary | ICD-10-CM

## 2024-05-19 DIAGNOSIS — R309 Painful micturition, unspecified: Secondary | ICD-10-CM | POA: Diagnosis not present

## 2024-05-19 DIAGNOSIS — K5900 Constipation, unspecified: Secondary | ICD-10-CM

## 2024-05-19 DIAGNOSIS — Z8759 Personal history of other complications of pregnancy, childbirth and the puerperium: Secondary | ICD-10-CM

## 2024-05-19 DIAGNOSIS — N941 Unspecified dyspareunia: Secondary | ICD-10-CM

## 2024-05-19 MED ORDER — DOCUSATE SODIUM 50 MG PO CAPS
50.0000 mg | ORAL_CAPSULE | Freq: Two times a day (BID) | ORAL | 2 refills | Status: AC
Start: 2024-05-19 — End: ?

## 2024-05-19 MED ORDER — HYDROCORTISONE (PERIANAL) 2.5 % EX CREA
1.0000 | TOPICAL_CREAM | Freq: Two times a day (BID) | CUTANEOUS | 2 refills | Status: AC
Start: 2024-05-19 — End: ?

## 2024-05-19 NOTE — Progress Notes (Unsigned)
 Established Patient Office Visit  Subjective   Patient ID: Hannah Mckay, female    DOB: 2001-05-13  Age: 23 y.o. MRN: [id number]  Chief Complaint  Patient presents with   Postpartum Care    HPI  {History (Optional):23778}  ROS    Objective:     BP 108/74   Pulse 72   Ht 5' 3 (1.6 m)   Wt 88 lb 9.6 oz (40.2 kg)   LMP  (LMP Unknown)   Breastfeeding No   BMI 15.69 kg/m  {Vitals History (Optional):23777}  Physical Exam   No results found for any visits on 05/19/24.  {Labs (Optional):23779}  The ASCVD Risk score (Arnett DK, et al., 2019) failed to calculate for the following reasons:   The 2019 ASCVD risk score is only valid for ages 59 to 51    Assessment & Plan:   Problem List Items Addressed This Visit   None   No follow-ups on file.    Grayce Shed, CMA    Post Partum Visit Note  Hannah Mckay is a 23 y.o. G48P1011 female who presents for a postpartum visit. She is 8 weeks postpartum following a vacuum-assisted vaginal delivery.  I have fully reviewed the prenatal and intrapartum course. The delivery was at 40 gestational weeks.  Anesthesia: epidural. Postpartum course has been good. Baby is doing well yes. Baby is feeding by bottle - Similac Advance. Bleeding no bleeding. Bowel function is normal. Bladder function is normal. Patient is sexually active. Contraception method is Depo-Provera  injections. Postpartum depression screening: negative.   The pregnancy intention screening data noted above was reviewed. Potential methods of contraception were discussed. The patient elected to proceed with No data recorded.   Edinburgh Postnatal Depression Scale - 05/19/24 1500       Edinburgh Postnatal Depression Scale:  In the Past 7 Days   I have been able to laugh and see the funny side of things. 0    I have looked forward with enjoyment to things. 0    I have blamed myself unnecessarily when things went wrong. 0    I have been anxious or worried  for no good reason. 0    I have felt scared or panicky for no good reason. 0    Things have been getting on top of me. 0    I have been so unhappy that I have had difficulty sleeping. 0    I have felt sad or miserable. 0    I have been so unhappy that I have been crying. 0    The thought of harming myself has occurred to me. 0    Edinburgh Postnatal Depression Scale Total 0          Health Maintenance Due  Topic Date Due   HPV VACCINES (1 - 3-dose series) Never done   DTaP/Tdap/Td (1 - Tdap) Never done   Hepatitis B Vaccines 19-59 Average Risk (1 of 3 - 19+ 3-dose series) Never done   Cervical Cancer Screening (Pap smear)  Never done   Influenza Vaccine  Never done   COVID-19 Vaccine (1 - 2025-26 season) Never done    {Common ambulatory SmartLinks:19316}  Review of Systems {ros; complete:30496}  Objective:  BP 108/74   Pulse 72   Ht 5' 3 (1.6 m)   Wt 88 lb 9.6 oz (40.2 kg)   LMP  (LMP Unknown)   Breastfeeding No   BMI 15.69 kg/m    General:  {gen appearance:16600}   Breasts:  {  desc; normal/abnormal/not indicated:14647}  Lungs: {lung exam:16931}  Heart:  {heart exam:5510}  Abdomen: {abdomen exam:16834}   Wound {Wound assessment:11097}  GU exam:  {desc; normal/abnormal/not indicated:14647}       Assessment:    There are no diagnoses linked to this encounter.  *** postpartum exam.   Plan:   Essential components of care per ACOG recommendations:  1.  Mood and well being: Patient with {gen negative/positive:315881} depression screening today. Reviewed local resources for support.  - Patient tobacco use? {tobacco use:25506}  - hx of drug use? {yes/no:25505}    2. Infant care and feeding:  -Patient currently breastmilk feeding? {yes/no:25502}  -Social determinants of health (SDOH) reviewed in EPIC. No concerns***The following needs were identified***  3. Sexuality, contraception and birth spacing - Patient {DOES_DOES WNU:81435} want a pregnancy in the next  year.  Desired family size is {NUMBER 1-10:22536} children.  - Reviewed reproductive life planning. Reviewed contraceptive methods based on pt preferences and effectiveness.  Patient desired {Upstream End Methods:24109} today.   - Discussed birth spacing of 18 months  4. Sleep and fatigue -Encouraged family/partner/community support of 4 hrs of uninterrupted sleep to help with mood and fatigue  5. Physical Recovery  - Discussed patients delivery and complications. She describes her labor as {description:25511} - Patient had a {CHL AMB [phone]}. Patient had a {laceration:25518} laceration. Perineal healing reviewed. Patient expressed understanding - Patient has urinary incontinence? {yes/no:25515} - Patient {ACTION; IS/IS WNU:[id number]} safe to resume physical and sexual activity  6.  Health Maintenance - HM due items addressed {Yes or If no, why not?:20788} - Last pap smear No results found for: DIAGPAP Pap smear {done:10129} at today's visit.  -Breast Cancer screening indicated? {indicated:25516}  7. Chronic Disease/Pregnancy Condition follow up: {Follow up:25499}  - PCP follow up  Grayce Shed, [facility] for [facility], [facility]

## 2024-05-19 NOTE — Progress Notes (Unsigned)
 Pt presents for pp. Pt has no questions or concerns at this time.

## 2024-05-19 NOTE — Patient Instructions (Addendum)
 [email or web address]  Constipation - Fiber supplement (OTC gummy, Metamucil)  - Magnesium supplement (OTC) - Colace (prescription)  Hemorrhoid  - Anusol (prescription) - Tucks (OTC)

## 2024-05-19 NOTE — Progress Notes (Unsigned)
 Post Partum Visit Note  Hannah Mckay is a 23 y.o. [id number] female who presents for a postpartum visit. She is 8 weeks postpartum following a vacuum-assisted vaginal delivery.  I have fully reviewed the prenatal and intrapartum course. The delivery was at 40 gestational weeks.  Anesthesia: epidural. Postpartum course has been good. Baby is doing well yes. Baby is feeding by bottle - Similac Advance. Bleeding no bleeding. Bowel function is normal. Bladder function is normal. Patient is sexually active. Contraception method is Depo-Provera  injections. Postpartum depression screening: negative.   Edinburgh Postnatal Depression Scale - 05/19/24 1500       Edinburgh Postnatal Depression Scale:  In the Past 7 Days   I have been able to laugh and see the funny side of things. 0    I have looked forward with enjoyment to things. 0    I have blamed myself unnecessarily when things went wrong. 0    I have been anxious or worried for no good reason. 0    I have felt scared or panicky for no good reason. 0    Things have been getting on top of me. 0    I have been so unhappy that I have had difficulty sleeping. 0    I have felt sad or miserable. 0    I have been so unhappy that I have been crying. 0    The thought of harming myself has occurred to me. 0    Edinburgh Postnatal Depression Scale Total 0         Health Maintenance Due  Topic Date Due   HPV VACCINES (1 - 3-dose series) Never done   DTaP/Tdap/Td (1 - Tdap) Never done   Hepatitis B Vaccines 19-59 Average Risk (1 of 3 - 19+ 3-dose series) Never done   Cervical Cancer Screening (Pap smear)  Never done   Influenza Vaccine  Never done   COVID-19 Vaccine (1 - 2025-26 season) Never done   The following portions of the patient's history were reviewed and updated as appropriate: allergies, current medications, past family history, past medical history, past social history, past surgical history, and problem list.  Review of  Systems Pertinent items are noted in HPI.  Objective:  BP 108/74   Pulse 72   Ht 5' 3 (1.6 m)   Wt 40.2 kg   LMP  (LMP Unknown)   Breastfeeding No   BMI 15.69 kg/m    General:  alert, cooperative, and no distress   Breasts:  not indicated  Lungs: Normal respiratory rate and effort  Heart:  Regular rate  Abdomen: Non-distended   Wound NA  GU exam: Deferred       Assessment:   1. Routine postpartum follow-up (Primary) - [redacted]w[redacted]d s/p VAVD with 2nd degree laceration - Patient reports sutures are no longer present and perineum appears normal, would prefer to not have pelvic exam today  2. Constipation, unspecified constipation type - Discussed OTC options including Miralax, fiber supplements, and Colace - Encouraged regular hydration - docusate sodium  (COLACE) 50 MG capsule; Take 1 capsule (50 mg total) by mouth 2 (two) times daily.  Dispense: 30 capsule; Refill: 2 - Ambulatory referral to Physical Therapy  3. Pain with urination - Reports feeling of spasm in pelvis during urination, will rule out infection - Urine Culture; Future  4. Hemorrhoids, unspecified hemorrhoid type - Discussed need for soft bowel movement, encouraged use of Anusol and Tuck's pads - hydrocortisone (ANUSOL-HC) 2.5 % rectal cream; Place 1 Application  rectally 2 (two) times daily.  Dispense: 28 g; Refill: 2  5. Dyspareunia, female - Describes severe pain with vaginal intercourse, will check for infection and refer to pelvic PT - Encouraged use of lubricant and to allow her body a few more weeks to recover - Cervicovaginal ancillary only( [place]) - Ambulatory referral to Physical Therapy  6. History of gestational hypertension - Patient given 30 day supply nifedipine  at discharge, no longer taking - Normotensive today   Plan:   Essential components of care per ACOG recommendations:  1.  Mood and well being: Patient with negative depression screening today. Reviewed local resources for  support.  - Patient tobacco use? No.   - hx of drug use? No.    2. Infant care and feeding:  -Patient currently breastmilk feeding? No.  -Social determinants of health (SDOH) reviewed in EPIC. No concerns.  3. Sexuality, contraception and birth spacing - Patient does not want a pregnancy in the next year. Desired family size is unknown. - Reviewed reproductive life planning. Reviewed contraceptive methods based on pt preferences and effectiveness.  Patient desired to continue Depo Provera  today.   - Discussed birth spacing of 18 months  4. Sleep and fatigue -Encouraged family/partner/community support of 4 hrs of uninterrupted sleep to help with mood and fatigue  5. Physical Recovery  - Discussed patients delivery and complications. She describes her labor as good. - Patient had a VAVD. Patient had a 2nd degree laceration. Perineal healing reviewed. Patient expressed understanding - Patient has urinary incontinence? No. - Patient is safe to resume physical and sexual activity whenever she feels ready.  6.  Health Maintenance - HM due items addressed Yes - Last pap smear 08/30/23 NILM. Pap smear not done at today's visit.  -Breast Cancer screening indicated? No.   7. Chronic Disease/Pregnancy Condition follow up: NA  - PCP follow up  Vernell Ruddle, [facility] 05/19/2024 5:35 PM

## 2024-05-21 ENCOUNTER — Ambulatory Visit: Payer: Self-pay | Admitting: Obstetrics and Gynecology

## 2024-05-21 LAB — CERVICOVAGINAL ANCILLARY ONLY
Bacterial Vaginitis (gardnerella): POSITIVE — AB
Candida Glabrata: NEGATIVE
Candida Vaginitis: NEGATIVE
Chlamydia: NEGATIVE
Comment: NEGATIVE
Comment: NEGATIVE
Comment: NEGATIVE
Comment: NEGATIVE
Comment: NEGATIVE
Comment: NORMAL
Neisseria Gonorrhea: NEGATIVE
Trichomonas: NEGATIVE

## 2024-05-21 MED ORDER — METRONIDAZOLE 500 MG PO TABS: 500.0000 mg | ORAL_TABLET | Freq: Two times a day (BID) | ORAL | 0 refills | Status: AC

## 2024-06-06 ENCOUNTER — Ambulatory Visit

## 2024-07-07 NOTE — Therapy (Incomplete)
 OUTPATIENT PHYSICAL THERAPY FEMALE PELVIC EVALUATION   Patient Name: Hannah Mckay MRN: [id number] DOB:Nov 19, 2000, 23 y.o., female Today's Date: 07/07/2024  END OF SESSION:   Past Medical History:  Diagnosis Date   Iron deficiency    Vacuum-assisted vaginal delivery 03/19/2024   Vitamin D deficiency    Past Surgical History:  Procedure Laterality Date   EYE SURGERY     Patient Active Problem List   Diagnosis Date Noted   History of gestational hypertension 03/20/2024    PCP: ***  REFERRING PROVIDER: Rudy Carlin LABOR, MD  REFERRING DIAG: K59.00 (ICD-10-CM) - Constipation, unspecified constipation type N94.10 (ICD-10-CM) - Dyspareunia, female Pain with vaginal intercourse 8w PP, 2nd degree lac Constipation  THERAPY DIAG:  No diagnosis found.  Rationale for Evaluation and Treatment: Rehabilitation  ONSET DATE: ***  SUBJECTIVE:                                                                                                                                                                                           SUBJECTIVE STATEMENT: *** Fluid intake:   FUNCTIONAL LIMITATIONS: ***  PERTINENT HISTORY:  Medications for current condition: *** Surgeries: *** Other: *** Sexual abuse: {Yes/No:304960894}  PAIN:  Are you having pain? {yes/no:20286} NPRS scale: ***/10 Pain location: {pelvic pain location:27098}  Pain type: {type:313116} Pain description: {PAIN DESCRIPTION:21022940}   Aggravating factors: *** Relieving factors: ***  PRECAUTIONS: {Therapy precautions:24002}  RED FLAGS: {PT Red Flags:29287}   WEIGHT BEARING RESTRICTIONS: {Yes ***/No:24003}  FALLS:  Has patient fallen in last 6 months? {fallsyesno:27318}  OCCUPATION: ***  ACTIVITY LEVEL : ***  PLOF: {PLOF:24004}  PATIENT GOALS: ***   BOWEL MOVEMENT: Pain with bowel movement: {yes/no:20286} Type of bowel movement:{PT BM type:27100} Fully empty rectum:  {No/Yes:304960894} Leakage: {Yes/No:304960894}                                                  Caused by: *** Bowel urgency: *** Pads: {Yes/No:304960894} Fiber supplement/laxative {YES/NO AS:20300}  URINATION: Pain with urination: {yes/no:20286} Fully empty bladder: {Yes/No:304960894}***                                         Post-void dribble: {YES/NO AS:20300} Stream: {PT urination:27102} Urgency: {YES/NO AS:20300} Frequency:during the day ***  Nocturia: {Yes/No:304960894}***   Leakage: {PT leakage:27103} Pads/briefs: {Yes/No:304960894}  INTERCOURSE:  Ability to have vaginal penetration {YES/NO:21197} Pain with intercourse: {pain with intercourse PA:27099} Dryness: {YES/NO AS:20300} Climax: *** Marinoff Scale: ***/3 Lubricant:  PREGNANCY: Vaginal deliveries *** Tearing {Yes***/No:304960894} Episiotomy {YES/NO AS:20300} C-section deliveries *** Currently pregnant {Yes***/No:304960894}  PROLAPSE: {PT prolapse:27101}   OBJECTIVE:  Note: Objective measures were completed at Evaluation unless otherwise noted.  DIAGNOSTIC FINDINGS:  Post-void residual: Voiding Cystourethrogram (VCUG):  Ultrasound: ***  PATIENT SURVEYS:  {rehab surveys:24030}  PFIQ-7: *** UIQ-7 *** CRAIG -7 *** POPIQ-7 *** Female Sexual Function Index (FSFI) Questionnaire ***  COGNITION: Overall cognitive status: {cognition:24006}     SENSATION: Light touch: {intact/deficits:24005}  LUMBAR SPECIAL TESTS:  {lumbar special test:25242}  FUNCTIONAL TESTS:  {Functional tests:24029} Single leg stance:  Rt:  Lt: Sit-up test: Squat: Bed mobility:  GAIT: Assistive device utilized: {Assistive devices:23999} Comments: ***  POSTURE: {posture:25561}   LUMBARAROM/PROM:  A/PROM A/PROM  Eval (% available)  Flexion   Extension   Right lateral flexion   Left lateral flexion   Right rotation   Left rotation    (Blank rows = not  tested)  LOWER EXTREMITY ROM:  {AROM/PROM:27142} ROM Right eval Left eval  Hip flexion    Hip extension    Hip abduction    Hip adduction    Hip internal rotation    Hip external rotation    Knee flexion    Knee extension    Ankle dorsiflexion    Ankle plantarflexion    Ankle inversion    Ankle eversion     (Blank rows = not tested)  LOWER EXTREMITY MMT:  MMT Right eval Left eval  Hip flexion    Hip extension    Hip abduction    Hip adduction    Hip internal rotation    Hip external rotation    Knee flexion    Knee extension    Ankle dorsiflexion    Ankle plantarflexion    Ankle inversion    Ankle eversion     (Blank rows = not tested) PALPATION:  General: ***  Pelvic Alignment: ***  Abdominal: ***  Diastasis: {Yes/No:304960894}*** Distortion: {YES/NO AS:20300}  Breathing: *** Scar tissue: {Yes/No:304960894}*** Active Straight Leg Raise: ***                External Perineal Exam: ***                             Internal Pelvic Floor: ***  Patient confirms identification and approves PT to assess internal pelvic floor and treatment {yes/no:20286} All internal or external pelvic floor assessments and/or treatments are completed with proper hand hygiene and gloves hands. If needed gloves are changed with hand hygiene during patient care time.  PELVIC MMT:   MMT eval  Vaginal   Internal Anal Sphincter   External Anal Sphincter   Puborectalis   (Blank rows = not tested)        TONE: ***  PROLAPSE: ***  TODAY'S TREATMENT:  DATE: ***  EVAL ***   PATIENT EDUCATION:  Education details: *** Person educated: {Person educated:25204} Education method: {Education Method:25205} Education comprehension: {Education Comprehension:25206}  HOME EXERCISE PROGRAM: ***  ASSESSMENT:  CLINICAL IMPRESSION: Patient is a *** y.o.  *** who was seen today for physical therapy evaluation and treatment for ***.   OBJECTIVE IMPAIRMENTS: {opptimpairments:25111}.   ACTIVITY LIMITATIONS: {activitylimitations:27494}  PARTICIPATION LIMITATIONS: {participationrestrictions:25113}  PERSONAL FACTORS: {Personal factors:25162} are also affecting patient's functional outcome.   REHAB POTENTIAL: {rehabpotential:25112}  CLINICAL DECISION MAKING: {clinical decision making:25114}  EVALUATION COMPLEXITY: {Evaluation complexity:25115}   GOALS: Goals reviewed with patient? {yes/no:20286}  SHORT TERM GOALS: Target date: ***  *** Baseline: Goal status: INITIAL  2.  *** Baseline:  Goal status: INITIAL  3.  *** Baseline:  Goal status: INITIAL  4.  *** Baseline:  Goal status: INITIAL  5.  *** Baseline:  Goal status: INITIAL  6.  *** Baseline:  Goal status: INITIAL  LONG TERM GOALS: Target date: ***  *** Baseline:  Goal status: INITIAL  2.  *** Baseline:  Goal status: INITIAL  3.  *** Baseline:  Goal status: INITIAL  4.  *** Baseline:  Goal status: INITIAL  5.  *** Baseline:  Goal status: INITIAL  6.  *** Baseline:  Goal status: INITIAL  PLAN:  PT FREQUENCY: {rehab frequency:25116}  PT DURATION: {rehab duration:25117}  PLANNED INTERVENTIONS: {rehab planned interventions:25118::97110-Therapeutic exercises,97530- Therapeutic [phone]- Neuromuscular re-education,97535- Self Rjmz,02859- Manual therapy,Patient/Family education}  PLAN FOR NEXT SESSION: ***   Hayes Czaja, PT 07/07/2024, 3:54 PM

## 2024-07-08 ENCOUNTER — Ambulatory Visit: Attending: Obstetrics | Admitting: Physical Therapy

## 2024-07-08 ENCOUNTER — Telehealth: Payer: Self-pay | Admitting: Physical Therapy

## 2024-07-08 NOTE — Telephone Encounter (Signed)
 Left patient a VM re no show for PT appt this morning. Patient to call us  at [phone]
